# Patient Record
Sex: Female | Born: 1990 | Race: White | Hispanic: No | Marital: Married | Smoking: Never smoker
Health system: Southern US, Community
[De-identification: ages and names within clinical notes are randomized; demographics above are authoritative.]

## PROBLEM LIST (undated history)

## (undated) DIAGNOSIS — F419 Anxiety disorder, unspecified: Secondary | ICD-10-CM

## (undated) HISTORY — PX: NO PAST SURGERIES: SHX2092

---
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
## 2011-04-02 ENCOUNTER — Ambulatory Visit (INDEPENDENT_AMBULATORY_CARE_PROVIDER_SITE_OTHER): Payer: BC Managed Care – PPO | Admitting: Physician Assistant

## 2011-04-02 DIAGNOSIS — H6980 Other specified disorders of Eustachian tube, unspecified ear: Secondary | ICD-10-CM

## 2011-04-02 DIAGNOSIS — J309 Allergic rhinitis, unspecified: Secondary | ICD-10-CM

## 2011-12-28 ENCOUNTER — Ambulatory Visit (INDEPENDENT_AMBULATORY_CARE_PROVIDER_SITE_OTHER): Payer: BC Managed Care – PPO | Admitting: Physician Assistant

## 2011-12-28 VITALS — BP 100/66 | HR 46 | Temp 97.6°F | Resp 16 | Ht 68.0 in | Wt 161.0 lb

## 2011-12-28 DIAGNOSIS — J029 Acute pharyngitis, unspecified: Secondary | ICD-10-CM

## 2011-12-28 LAB — POCT RAPID STREP A (OFFICE): Rapid Strep A Screen: NEGATIVE

## 2011-12-28 MED ORDER — MAGIC MOUTHWASH W/LIDOCAINE: 10.0000 mL | ORAL | Status: DC | PRN

## 2011-12-28 MED ORDER — AZITHROMYCIN 250 MG PO TABS: 250.0000 mg | ORAL_TABLET | Freq: Two times a day (BID) | ORAL | Status: DC

## 2011-12-28 NOTE — Progress Notes (Signed)
  Subjective:    Patient ID: Holly Bryan, female    DOB: 1990-05-29, 21 y.o.   MRN: [id number]  HPI 21 year old female presents with acute onset of sore throat and painful bumps on her tongue. Symptoms started 5 days ago. States it is painful to swallow and has been worsening since onset.  Denies cough, fever, chills, nausea, vomiting, abdominal pain, headache, or nasal congestion. Does have chronic rhinorrhea which she attributes to allergies.  States the bumps on her tongue do seem to be worse at night and get better during the day.  No history of similar lesions.      Review of Systems  Constitutional: Negative for fever and chills.  HENT: Positive for sore throat, rhinorrhea and mouth sores (on tongue). Negative for ear pain, congestion and trouble swallowing.   Gastrointestinal: Negative for nausea, vomiting and abdominal pain.  Neurological: Negative for headaches.  All other systems reviewed and are negative.       Objective:   Physical Exam  Constitutional: She is oriented to person, place, and time. She appears well-developed and well-nourished.  HENT:  Head: Normocephalic and atraumatic.  Right Ear: Hearing, tympanic membrane, external ear and ear canal normal.  Left Ear: Hearing, tympanic membrane, external ear and ear canal normal.  Mouth/Throat: Uvula is midline, oropharynx is clear and moist and mucous membranes are normal. No oropharyngeal exudate (bilateral tonsillar erythema, no tonsillar swelling).       Tongue has multiple raised, inflamed taste buds. No ulceration or vesicles.    Eyes: Conjunctivae normal are normal.  Neck: Normal range of motion. Neck supple.  Cardiovascular: Normal rate, regular rhythm and normal heart sounds.   Pulmonary/Chest: Effort normal and breath sounds normal.  Lymphadenopathy:    She has no cervical adenopathy.  Neurological: She is alert and oriented to person, place, and time.  Psychiatric: She has a normal mood and affect. Her  behavior is normal. Judgment and thought content normal.          Assessment & Plan:   1. Acute pharyngitis  POCT rapid strep A, Culture, Group A Strep, Alum & Mag Hydroxide-Simeth (MAGIC MOUTHWASH W/LIDOCAINE) SOLN, azithromycin (ZITHROMAX) 250 MG tablet  throat culture sent Will treat to cover strep infection Use [facility]'s mouthwash prn pain Ibuprofen or tylenol as needed for sore throat Follow up if symptoms worsen or fail to improve.

## 2011-12-30 LAB — CULTURE, GROUP A STREP: Organism ID, Bacteria: NORMAL

## 2012-12-13 ENCOUNTER — Ambulatory Visit (INDEPENDENT_AMBULATORY_CARE_PROVIDER_SITE_OTHER): Payer: BC Managed Care – PPO | Admitting: Family Medicine

## 2012-12-13 VITALS — BP 98/65 | HR 50 | Temp 97.7°F | Resp 18 | Ht 68.0 in | Wt 169.0 lb

## 2012-12-13 DIAGNOSIS — B360 Pityriasis versicolor: Secondary | ICD-10-CM

## 2012-12-13 DIAGNOSIS — B350 Tinea barbae and tinea capitis: Secondary | ICD-10-CM

## 2012-12-13 DIAGNOSIS — J309 Allergic rhinitis, unspecified: Secondary | ICD-10-CM

## 2012-12-13 DIAGNOSIS — J019 Acute sinusitis, unspecified: Secondary | ICD-10-CM

## 2012-12-13 MED ORDER — IPRATROPIUM BROMIDE 0.03 % NA SOLN: 2.0000 | Freq: Four times a day (QID) | NASAL | Status: DC

## 2012-12-13 MED ORDER — AZITHROMYCIN 250 MG PO TABS: ORAL_TABLET | ORAL | Status: DC

## 2012-12-13 NOTE — Patient Instructions (Addendum)
We are going to try treating your for a sinus infection and underlying allergies.    Use the azithromycin as directed.  Also, try a daily claritin or zyrtec.  Use the nasal spray as needed.  If this is not helpful we might also try a steroid spray.   Try some Selsun Blue shampoo on your back to clear up your tinea versicolor.    Let me know if you are not better in the next week or so- Sooner if worse.

## 2012-12-13 NOTE — Progress Notes (Signed)
[facility] [address] [place] [address] [phone]  Date:  12/13/2012   Name:  Holly Bryan   DOB:  08/04/1990   MRN:  [id number]  PCP:  No PCP Per Patient    Chief Complaint: stuffy, congestion, cough, ear pain, ear popping   History of Present Illness:  Holly Bryan is a 22 y.o. very pleasant female patient who presents with the following:  She is here today with chronic congestion.  She has felt worse over the last week or so.   She noted onset of cough a few weeks ago- this is now resolved, but her nose is very stuffy and runny.  She does sneeze some.  "I don't know if it's allergies."   She has not noted any fever or chills. Cough is sometimes productive.  She sometimes has pressure and pain in her sinuses.   Headaches this week.   No GI symptoms.    She is generally healthy.   She has used a nasal spray, and either mucinex or claritin.    LMP 11/23/12  There are no active problems to display for this patient.   History reviewed. No pertinent past medical history.  History reviewed. No pertinent past surgical history.  History  Substance Use Topics  . Smoking status: Never Smoker   . Smokeless tobacco: Not on file  . Alcohol Use: Not on file    Family History  Problem Relation Age of Onset  . Lymphoma Father     Allergies  Allergen Reactions  . Amoxicillin     Childhood allergy    Medication list has been reviewed and updated.  Current Outpatient Prescriptions on File Prior to Visit  Medication Sig Dispense Refill  . norethindrone-ethinyl estradiol (JUNEL FE,GILDESS FE,LOESTRIN FE) 1-20 MG-MCG tablet Take 1 tablet by mouth daily.      . Alum & Mag Hydroxide-Simeth (MAGIC MOUTHWASH W/LIDOCAINE) SOLN Take 10 mLs by mouth every 2 (two) hours as needed. Use 1:1 ratio with viscous lidocaine and [facility]'s mouthwash  360 mL  0  . azithromycin (ZITHROMAX) 250 MG tablet Take 1 tablet (250 mg total) by mouth 2 (two) times daily.  10 each   0   No current facility-administered medications on file prior to visit.    Review of Systems:  As per HPI- otherwise negative.   Physical Examination: Filed Vitals:   12/13/12 0807  BP: 98/62  Pulse: 50  Temp: 97.7 F (36.5 C)  Resp: 18   Filed Vitals:   12/13/12 0807  Height: 5\' 8"  (1.727 m)  Weight: 169 lb (76.658 kg)   Body mass index is 25.7 kg/(m^2). Ideal Body Weight: Weight in (lb) to have BMI = 25: 164.1  GEN: WDWN, NAD, Non-toxic, A & O x 3 HEENT: Atraumatic, Normocephalic. Neck supple. No masses, No LAD.  Bilateral TM wnl, oropharynx normal.  PEERL,EOMI.   Nasal congestion, sinuses are tender Ears and Nose: No external deformity. CV: RRR, No M/G/R. No JVD. No thrill. No extra heart sounds. PULM: CTA B, no wheezes, crackles, rhonchi. No retractions. No resp. distress. No accessory muscle use. EXTR: No c/c/e NEURO Normal gait.  PSYCH: Normally interactive. Conversant. Not depressed or anxious appearing.  Calm demeanor.  She has a rash typical of tinea versicolor on her back.   Assessment and Plan: Sinusitis, acute - Plan: azithromycin (ZITHROMAX) 250 MG tablet  Allergic rhinitis - Plan: ipratropium (ATROVENT) 0.03 % nasal spray  Tinea versicolor  Treat as above for  AR/ sinusitis.  OTC selsum blue shampoo for Tinea versicolor  Signed Abbe Amsterdam, MD

## 2013-06-17 ENCOUNTER — Ambulatory Visit (INDEPENDENT_AMBULATORY_CARE_PROVIDER_SITE_OTHER): Payer: BC Managed Care – PPO | Admitting: Physician Assistant

## 2013-06-17 VITALS — BP 110/60 | HR 62 | Temp 98.3°F | Resp 16 | Ht 67.75 in | Wt 166.0 lb

## 2013-06-17 DIAGNOSIS — R350 Frequency of micturition: Secondary | ICD-10-CM

## 2013-06-17 DIAGNOSIS — N898 Other specified noninflammatory disorders of vagina: Secondary | ICD-10-CM

## 2013-06-17 LAB — POCT URINALYSIS DIPSTICK
Bilirubin, UA: NEGATIVE
Glucose, UA: NEGATIVE
Ketones, UA: NEGATIVE
Nitrite, UA: NEGATIVE
Protein, UA: NEGATIVE
Spec Grav, UA: 1.01
Urobilinogen, UA: 0.2
pH, UA: 7.5

## 2013-06-17 LAB — POCT WET PREP WITH KOH
KOH Prep POC: NEGATIVE
TRICHOMONAS UA: NEGATIVE
YEAST WET PREP PER HPF POC: NEGATIVE

## 2013-06-17 LAB — POCT UA - MICROSCOPIC ONLY
Casts, Ur, LPF, POC: NEGATIVE
Crystals, Ur, HPF, POC: NEGATIVE
Epithelial cells, urine per micros: NEGATIVE
Mucus, UA: NEGATIVE
Yeast, UA: NEGATIVE

## 2013-06-17 MED ORDER — PHENAZOPYRIDINE HCL 200 MG PO TABS: 200.0000 mg | ORAL_TABLET | Freq: Three times a day (TID) | ORAL | Status: DC | PRN

## 2013-06-17 MED ORDER — NITROFURANTOIN MONOHYD MACRO 100 MG PO CAPS: 100.0000 mg | ORAL_CAPSULE | Freq: Two times a day (BID) | ORAL | Status: DC

## 2013-06-17 NOTE — Progress Notes (Signed)
Subjective:    Patient ID: Holly Bryan, female    DOB: [birth year], 23 y.o.   MRN: [id number]  HPI Pt presents to clinic with urinary symptoms for the last 2 weeks - today at work she started to develop left sided back pain that made her leave work early.  She has dysuria and urinary urgency - she is not having nausea or fever or chills.  OTC meds - none No change in sexual partners  Review of Systems  Constitutional: Negative for fever and chills.  Gastrointestinal: Positive for abdominal pain.  Genitourinary: Positive for dysuria and frequency. Negative for urgency and vaginal discharge.       Strong smelling urine  Musculoskeletal: Positive for back pain (L side).       Objective:   Physical Exam  Vitals reviewed. Constitutional: She is oriented to person, place, and time. She appears well-developed and well-nourished.  HENT:  Head: Normocephalic and atraumatic.  Right Ear: External ear normal.  Left Ear: External ear normal.  Eyes: Conjunctivae are normal.  Neck: Normal range of motion.  Cardiovascular: Normal rate, regular rhythm and normal heart sounds.   No murmur heard. Pulmonary/Chest: Effort normal and breath sounds normal. She has no wheezes.  Abdominal: Soft. There is tenderness (suprapubic TTP). There is no CVA tenderness.  Musculoskeletal:       Back:  Neurological: She is alert and oriented to person, place, and time.  Skin: Skin is warm and dry.  Psychiatric: She has a normal mood and affect. Her behavior is normal. Judgment and thought content normal.   Results for orders placed in visit on 06/17/13  POCT URINALYSIS DIPSTICK      Result Value Ref Range   Color, UA light yellow     Clarity, UA clear     Glucose, UA neg     Bilirubin, UA neg     Ketones, UA neg     Spec Grav, UA 1.010     Blood, UA moderate     pH, UA 7.5     Protein, UA neg     Urobilinogen, UA 0.2     Nitrite, UA neg     Leukocytes, UA small (1+)    POCT UA - MICROSCOPIC ONLY      Result Value Ref Range   WBC, Ur, HPF, POC 0-1     RBC, urine, microscopic 0-2     Bacteria, U Microscopic trace     Mucus, UA neg     Epithelial cells, urine per micros neg     Crystals, Ur, HPF, POC neg     Casts, Ur, LPF, POC neg     Yeast, UA neg    POCT WET PREP WITH KOH      Result Value Ref Range   Trichomonas, UA Negative     Clue Cells Wet Prep HPF POC 2-4     Epithelial Wet Prep HPF POC 10-15     Yeast Wet Prep HPF POC neg     Bacteria Wet Prep HPF POC 3+     RBC Wet Prep HPF POC 0-1     WBC Wet Prep HPF POC 8-14     KOH Prep POC Negative         Assessment & Plan:  Urinary frequency - Plan: POCT urinalysis dipstick, POCT UA - Microscopic Only, Urine culture, nitrofurantoin, macrocrystal-monohydrate, (MACROBID) 100 MG capsule, phenazopyridine (PYRIDIUM) 200 MG tablet  Vaginal discharge - Plan: GC/Chlamydia Probe Amp, POCT Wet Prep with  KOH  Pt does not have BV and her symptoms are consistent with UTI so we will treat with abx while waiting for the urine culture.  I suspect her back pain is muscular in origin.  We will send uriprobe because it was collected and she requested that it be sent even with clear wet prep.  She drinks a lot of water so I wonder if her urinary results are due to her dilute urine.  Answered pt's questions.  Benny LennertSarah Teyonna Plaisted PA-C  [facility] 06/17/2013 5:42 PM

## 2013-06-18 LAB — GC/CHLAMYDIA PROBE AMP
CT Probe RNA: NEGATIVE
GC PROBE AMP APTIMA: NEGATIVE

## 2013-06-19 LAB — URINE CULTURE: Colony Count: >=100000

## 2019-01-12 ENCOUNTER — Other Ambulatory Visit: Payer: Self-pay

## 2019-01-12 DIAGNOSIS — Z20822 Contact with and (suspected) exposure to covid-19: Secondary | ICD-10-CM

## 2019-01-13 LAB — NOVEL CORONAVIRUS, NAA: SARS-CoV-2, NAA: NOT DETECTED

## 2019-05-05 ENCOUNTER — Other Ambulatory Visit: Payer: Self-pay

## 2019-11-18 ENCOUNTER — Other Ambulatory Visit: Payer: Self-pay

## 2020-02-28 LAB — OB RESULTS CONSOLE RPR: RPR: NONREACTIVE

## 2020-02-28 LAB — OB RESULTS CONSOLE RUBELLA ANTIBODY, IGM: Rubella: IMMUNE

## 2020-02-28 LAB — OB RESULTS CONSOLE HEPATITIS B SURFACE ANTIGEN: Hepatitis B Surface Ag: NEGATIVE

## 2020-02-28 LAB — OB RESULTS CONSOLE HIV ANTIBODY (ROUTINE TESTING): HIV: NONREACTIVE

## 2020-03-12 LAB — OB RESULTS CONSOLE GC/CHLAMYDIA
Chlamydia: NEGATIVE
Gonorrhea: NEGATIVE

## 2020-08-09 ENCOUNTER — Ambulatory Visit (HOSPITAL_COMMUNITY)
Admission: EM | Admit: 2020-08-09 | Discharge: 2020-08-09 | Disposition: A | Discharge status: Home / Self Care | Payer: 59 | Attending: Internal Medicine | Admitting: Internal Medicine

## 2020-08-09 ENCOUNTER — Encounter (HOSPITAL_COMMUNITY): Payer: Self-pay

## 2020-08-09 ENCOUNTER — Other Ambulatory Visit: Payer: Self-pay

## 2020-08-09 DIAGNOSIS — Z79899 Other long term (current) drug therapy: Secondary | ICD-10-CM | POA: Insufficient documentation

## 2020-08-09 DIAGNOSIS — Z882 Allergy status to sulfonamides status: Secondary | ICD-10-CM | POA: Diagnosis not present

## 2020-08-09 DIAGNOSIS — O98513 Other viral diseases complicating pregnancy, third trimester: Secondary | ICD-10-CM | POA: Diagnosis present

## 2020-08-09 DIAGNOSIS — J028 Acute pharyngitis due to other specified organisms: Secondary | ICD-10-CM | POA: Diagnosis not present

## 2020-08-09 DIAGNOSIS — Z880 Allergy status to penicillin: Secondary | ICD-10-CM | POA: Insufficient documentation

## 2020-08-09 DIAGNOSIS — Z3A32 32 weeks gestation of pregnancy: Secondary | ICD-10-CM | POA: Insufficient documentation

## 2020-08-09 DIAGNOSIS — B9789 Other viral agents as the cause of diseases classified elsewhere: Secondary | ICD-10-CM | POA: Diagnosis not present

## 2020-08-09 DIAGNOSIS — Z8616 Personal history of COVID-19: Secondary | ICD-10-CM | POA: Diagnosis not present

## 2020-08-09 DIAGNOSIS — U071 COVID-19: Secondary | ICD-10-CM | POA: Diagnosis not present

## 2020-08-09 DIAGNOSIS — J029 Acute pharyngitis, unspecified: Secondary | ICD-10-CM

## 2020-08-09 LAB — SARS CORONAVIRUS 2 (TAT 6-24 HRS): SARS Coronavirus 2: POSITIVE — AB

## 2020-08-09 MED ORDER — BENZONATATE 100 MG PO CAPS: 100.0000 mg | ORAL_CAPSULE | Freq: Three times a day (TID) | ORAL | 0 refills | Status: DC

## 2020-08-09 NOTE — Discharge Instructions (Addendum)
Salt water gargle Increase oral fluid intake We will call you with recommendations if your labs are abnormal Return to urgent care if symptoms worsen Tylenol as needed for fever and/or pain.

## 2020-08-09 NOTE — ED Triage Notes (Signed)
Pt reports sore throat, itchy throat and cough since last night. Denies fever, chills.   Pt reports she is 32 1/2 weeks pregant.

## 2020-08-10 NOTE — ED Provider Notes (Signed)
[facility]    CSN: [id number] Arrival date & time: 08/09/20  1337      History   Chief Complaint Chief Complaint  Patient presents with  . Sore Throat    HPI Holly Bryan is a 30 y.o. female comes to the urgent care with a 1 day history of sore throat, itchy throat and a nonproductive cough.  Patient denies any fever or chills.  No sick contacts.  Patient is currently [redacted] weeks pregnant.   She has some rhinorrhea.  No nausea, vomiting or diarrhea.  Patient is not vaccinated against COVID-19 virus.  HPI  History reviewed. No pertinent past medical history.  There are no problems to display for this patient.   History reviewed. No pertinent surgical history.  OB History   No obstetric history on file.      Home Medications    Prior to Admission medications   Medication Sig Start Date End Date Taking? Authorizing Provider  Prenatal Vit-DSS-Fe Cbn-FA (PRENATAL AD PO) Take by mouth.   Yes [provider]  norethindrone-ethinyl estradiol (JUNEL FE,GILDESS FE,LOESTRIN FE) 1-20 MG-MCG tablet Take 1 tablet by mouth daily.  08/09/20  [provider]    Family History Family History  Problem Relation Age of Onset  . Lymphoma Father     Social History Social History   Tobacco Use  . Smoking status: Never Smoker  . Smokeless tobacco: Never Used  Substance Use Topics  . Alcohol use: Never  . Drug use: Never     Allergies   Amoxicillin and Sulfa antibiotics   Review of Systems Review of Systems  Constitutional: Negative.   HENT: Positive for congestion and sore throat.   Respiratory: Positive for cough.   Gastrointestinal: Negative.   Genitourinary: Negative.      Physical Exam Triage Vital Signs ED Triage Vitals  Enc Vitals Group     BP 08/09/20 1449 107/69     Pulse Rate 08/09/20 1449 (!) 107     Resp 08/09/20 1449 18     Temp 08/09/20 1449 99 F (37.2 C)     Temp Source 08/09/20 1449 Oral     SpO2 08/09/20 1449 98 %      Weight --      Height --      Head Circumference --      Peak Flow --      Pain Score 08/09/20 1444 5     Pain Loc --      Pain Edu? --      Excl. in GC? --    No data found.  Updated Vital Signs BP 107/69 (BP Location: Left Arm)   Pulse (!) 107   Temp 99 F (37.2 C) (Oral)   Resp 18   SpO2 98%   Visual Acuity Right Eye Distance:   Left Eye Distance:   Bilateral Distance:    Right Eye Near:   Left Eye Near:    Bilateral Near:     Physical Exam Vitals and nursing note reviewed.  HENT:     Right Ear: Tympanic membrane normal.     Left Ear: Tympanic membrane normal.     Mouth/Throat:     Mouth: Mucous membranes are moist.     Pharynx: No posterior oropharyngeal erythema.  Cardiovascular:     Rate and Rhythm: Normal rate and regular rhythm.     Heart sounds: Normal heart sounds.  Pulmonary:     Effort: Pulmonary effort is normal.  Breath sounds: Normal breath sounds.  Neurological:     Mental Status: She is alert.      UC Treatments / Results  Labs (all labs ordered are listed, but only abnormal results are displayed) Labs Reviewed  SARS CORONAVIRUS 2 (TAT 6-24 HRS) - Abnormal; Notable for the following components:      Result Value   SARS Coronavirus 2 POSITIVE (*)    All other components within normal limits    EKG   Radiology No results found.  Procedures Procedures (including critical care time)  Medications Ordered in UC Medications - No data to display  Initial Impression / Assessment and Plan / UC Course  I have reviewed the triage vital signs and the nursing notes.  Pertinent labs & imaging results that were available during my care of the patient were reviewed by me and considered in my medical decision making (see chart for details).     1.  Viral pharyngitis: COVID-19 PCR test sent Patient is advised to quarantine until COVID-19 test results are available Increase oral fluid intake Tylenol as needed for fever and/or body  aches. If symptoms worsen patient is advised to return to urgent care to be reevaluated.   Final Clinical Impressions(s) / UC Diagnoses   Final diagnoses:  Viral pharyngitis     Discharge Instructions     Salt water gargle Increase oral fluid intake We will call you with recommendations if your labs are abnormal Return to urgent care if symptoms worsen Tylenol as needed for fever and/or pain.   ED Prescriptions    Medication Sig Dispense Auth. Provider   benzonatate (TESSALON) 100 MG capsule  (Status: Discontinued) Take 1 capsule (100 mg total) by mouth every 8 (eight) hours. 21 capsule Keyli Duross, Britta Mccreedy, MD     PDMP not reviewed this encounter.   Merrilee Jansky, MD 08/10/20 [phone]

## 2020-08-27 ENCOUNTER — Other Ambulatory Visit: Payer: Self-pay | Admitting: Obstetrics and Gynecology

## 2020-08-27 DIAGNOSIS — R748 Abnormal levels of other serum enzymes: Secondary | ICD-10-CM

## 2020-08-31 ENCOUNTER — Ambulatory Visit
Admission: RE | Admit: 2020-08-31 | Discharge: 2020-08-31 | Disposition: A | Discharge status: Home / Self Care | Payer: 59 | Source: Ambulatory Visit | Attending: Obstetrics and Gynecology | Admitting: Obstetrics and Gynecology

## 2020-08-31 DIAGNOSIS — R748 Abnormal levels of other serum enzymes: Secondary | ICD-10-CM

## 2020-09-04 LAB — OB RESULTS CONSOLE GBS: GBS: POSITIVE

## 2020-09-11 ENCOUNTER — Other Ambulatory Visit: Payer: 59

## 2020-09-27 ENCOUNTER — Inpatient Hospital Stay (HOSPITAL_COMMUNITY)
Admission: AD | Admit: 2020-09-27 | Discharge: 2020-09-30 | DRG: 807 | Disposition: A | Discharge status: Home / Self Care | Payer: 59 | Attending: Obstetrics and Gynecology | Admitting: Obstetrics and Gynecology

## 2020-09-27 ENCOUNTER — Other Ambulatory Visit: Payer: Self-pay

## 2020-09-27 ENCOUNTER — Encounter (HOSPITAL_COMMUNITY): Payer: Self-pay | Admitting: Obstetrics and Gynecology

## 2020-09-27 DIAGNOSIS — O26893 Other specified pregnancy related conditions, third trimester: Secondary | ICD-10-CM | POA: Diagnosis present

## 2020-09-27 DIAGNOSIS — Z3A39 39 weeks gestation of pregnancy: Secondary | ICD-10-CM

## 2020-09-27 DIAGNOSIS — Z3490 Encounter for supervision of normal pregnancy, unspecified, unspecified trimester: Secondary | ICD-10-CM

## 2020-09-27 DIAGNOSIS — O99824 Streptococcus B carrier state complicating childbirth: Secondary | ICD-10-CM | POA: Diagnosis present

## 2020-09-27 HISTORY — DX: Anxiety disorder, unspecified: F41.9

## 2020-09-27 LAB — CBC
HCT: 34.8 % — ABNORMAL LOW (ref 36.0–46.0)
Hemoglobin: 12.1 g/dL (ref 12.0–15.0)
MCH: 30.6 pg (ref 26.0–34.0)
MCHC: 34.8 g/dL (ref 30.0–36.0)
MCV: 88.1 fL (ref 80.0–100.0)
Platelets: 263 10*3/uL (ref 150–400)
RBC: 3.95 MIL/uL (ref 3.87–5.11)
RDW: 13 % (ref 11.5–15.5)
WBC: 12.9 10*3/uL — ABNORMAL HIGH (ref 4.0–10.5)
nRBC: 0 % (ref 0.0–0.2)

## 2020-09-27 LAB — TYPE AND SCREEN
ABO/RH(D): A POS
Antibody Screen: NEGATIVE

## 2020-09-27 MED ORDER — SOD CITRATE-CITRIC ACID 500-334 MG/5ML PO SOLN: 30.0000 mL | ORAL | Status: DC | PRN

## 2020-09-27 MED ORDER — PENICILLIN G POT IN DEXTROSE 60000 UNIT/ML IV SOLN
3.0000 10*6.[IU] | INTRAVENOUS | Status: DC
  Administered 2020-09-27 – 2020-09-28 (×3): 3 10*6.[IU] via INTRAVENOUS
  Filled 2020-09-27 (×3): qty 50

## 2020-09-27 MED ORDER — TERBUTALINE SULFATE 1 MG/ML IJ SOLN: 0.2500 mg | Freq: Once | INTRAMUSCULAR | Status: DC | PRN

## 2020-09-27 MED ORDER — LACTATED RINGERS IV SOLN: INTRAVENOUS | Status: DC

## 2020-09-27 MED ORDER — LIDOCAINE HCL (PF) 1 % IJ SOLN
30.0000 mL | INTRAMUSCULAR | Status: AC | PRN
  Administered 2020-09-28: 30 mL via SUBCUTANEOUS
  Filled 2020-09-27: qty 30

## 2020-09-27 MED ORDER — OXYTOCIN BOLUS FROM INFUSION
333.0000 mL | Freq: Once | INTRAVENOUS | Status: AC
  Administered 2020-09-28: 333 mL via INTRAVENOUS

## 2020-09-27 MED ORDER — ACETAMINOPHEN 325 MG PO TABS: 650.0000 mg | ORAL_TABLET | ORAL | Status: DC | PRN

## 2020-09-27 MED ORDER — OXYCODONE-ACETAMINOPHEN 5-325 MG PO TABS
1.0000 | ORAL_TABLET | ORAL | Status: DC | PRN
Start: 2020-09-27 — End: 2020-09-28

## 2020-09-27 MED ORDER — ONDANSETRON HCL 4 MG/2ML IJ SOLN: 4.0000 mg | Freq: Four times a day (QID) | INTRAMUSCULAR | Status: DC | PRN

## 2020-09-27 MED ORDER — BUTORPHANOL TARTRATE 1 MG/ML IJ SOLN
1.0000 mg | INTRAMUSCULAR | Status: DC | PRN
  Administered 2020-09-27 – 2020-09-28 (×2): 1 mg via INTRAVENOUS
  Filled 2020-09-27 (×2): qty 1

## 2020-09-27 MED ORDER — LACTATED RINGERS IV SOLN
500.0000 mL | INTRAVENOUS | Status: DC | PRN
Start: 2020-09-27 — End: 2020-09-28

## 2020-09-27 MED ORDER — OXYTOCIN-SODIUM CHLORIDE 30-0.9 UT/500ML-% IV SOLN: 2.5000 [IU]/h | INTRAVENOUS | Status: DC

## 2020-09-27 MED ORDER — OXYCODONE-ACETAMINOPHEN 5-325 MG PO TABS: 2.0000 | ORAL_TABLET | ORAL | Status: DC | PRN

## 2020-09-27 MED ORDER — PENICILLIN G POTASSIUM 5000000 UNITS IJ SOLR
5.0000 10*6.[IU] | Freq: Once | INTRAMUSCULAR | Status: AC
  Administered 2020-09-27: 5 10*6.[IU] via INTRAVENOUS
  Filled 2020-09-27: qty 5

## 2020-09-27 MED ORDER — OXYTOCIN-SODIUM CHLORIDE 30-0.9 UT/500ML-% IV SOLN
1.0000 m[IU]/min | INTRAVENOUS | Status: DC
  Administered 2020-09-27: 2 m[IU]/min via INTRAVENOUS
  Filled 2020-09-27: qty 500

## 2020-09-27 MED ORDER — HYDROXYZINE HCL 50 MG PO TABS: 50.0000 mg | ORAL_TABLET | Freq: Four times a day (QID) | ORAL | Status: DC | PRN

## 2020-09-27 NOTE — MAU Note (Signed)
.  Holly Bryan is a 30 y.o. at [redacted]w[redacted]d here in MAU reporting: sent from office for SROM. Patient unaware of when her water broke. Also has bloody show. Was 4.5 cm in the office today with pooling on exam. GBS pos  Pain score: 4 Vitals:   09/27/20 1753  BP: 132/78  Pulse: 79  Resp: 17  Temp: 98.3 F (36.8 C)  SpO2: 100%

## 2020-09-27 NOTE — Progress Notes (Signed)
Labor Progress Note  Patient reports increased contraction pain and frequency.  Bloody show present.  It was been 4 hrs since she was checked in office.  Cervix with change to 5/90/-2.  Cervix posterior and to patient left.    FHT Cat 1 since admission, very active baby.  Discussed PCN time (will be 4 hrs of coverage around 11 pm).  Her labor is progressing, but will plan to augment at next check if not significantly changed.  Discussed s/s chorioamnionitis.  All questions answered.  Epidural when desired.  Nilda Simmer MD

## 2020-09-27 NOTE — H&P (Signed)
OB History and Physical   Holly Bryan is a 30 y.o. female G1P0 presenting for SROM at [redacted]w[redacted]d.  She presented to her routine OB visit today and SROM was confirmed on speculum exam.  Cervix 4/90/-2. She reports good FM and irregular contractions. She reports bloody show since arrival. She denies fever, chills.   OB History     Gravida  1   Para      Term      Preterm      AB      Living         SAB      IAB      Ectopic      Multiple      Live Births             Past Medical History:  Diagnosis Date   Anxiety    Past Surgical History:  Procedure Laterality Date   NO PAST SURGERIES     Family History: family history includes Lymphoma in her father. Social History:  reports that she has never smoked. She has never used smokeless tobacco. She reports that she does not drink alcohol and does not use drugs.     Maternal Diabetes: No Genetic Screening: Normal Maternal Ultrasounds/Referrals: Normal Fetal Ultrasounds or other Referrals:  None Maternal Substance Abuse:  No Significant Maternal Medications:  None Significant Maternal Lab Results:  Group B Strep positive Other Comments:  None  Review of Systems See HPI History Exam by:: Dr. Crissie Reese Blood pressure 119/73, pulse 62, temperature 98.3 F (36.8 C), temperature source Oral, resp. rate 18, height 5\' 8"  (1.727 m), weight 88.5 kg, SpO2 100 %. Exam Physical Exam  Gen: alert, well appearing, no distress Chest: nonlabored breathing CV: no peripheral edema Abdomen: soft, nontender Ext: no evidence of DVT  Prenatal labs: ABO, Rh: --/--/A POS (07/07 1820) Antibody: NEG (07/07 1820) Rubella:   RI RPR:   WNL HBsAg:   WNL HIV:   neg GBS:   Positive, clindamycin resistant  Assessment/Plan: Admit to Labor and Delivery Unsure of time of ROM, suspect slow leak.  SROM confirmed in office with speculum exam. GBS positive with Clindamycin resistance. Chart states Amoxicillin allergy, however patient  reports this was previous error when she meant to say Sulfa Abx. I again confirmed no PCN allergy. Epidural when desired Augment with pitocin Anticipate vaginal delivery.    11-02-1982 09/27/2020, 7:24 PM

## 2020-09-28 ENCOUNTER — Inpatient Hospital Stay (HOSPITAL_COMMUNITY): Payer: 59 | Admitting: Anesthesiology

## 2020-09-28 ENCOUNTER — Encounter (HOSPITAL_COMMUNITY): Payer: Self-pay | Admitting: Obstetrics and Gynecology

## 2020-09-28 LAB — RPR: RPR Ser Ql: NONREACTIVE

## 2020-09-28 MED ORDER — PHENYLEPHRINE 40 MCG/ML (10ML) SYRINGE FOR IV PUSH (FOR BLOOD PRESSURE SUPPORT): 80.0000 ug | PREFILLED_SYRINGE | INTRAVENOUS | Status: DC | PRN

## 2020-09-28 MED ORDER — DIPHENHYDRAMINE HCL 50 MG/ML IJ SOLN: 12.5000 mg | INTRAMUSCULAR | Status: DC | PRN

## 2020-09-28 MED ORDER — COCONUT OIL OIL: 1.0000 "application " | TOPICAL_OIL | Status: DC | PRN

## 2020-09-28 MED ORDER — ONDANSETRON HCL 4 MG/2ML IJ SOLN: 4.0000 mg | INTRAMUSCULAR | Status: DC | PRN

## 2020-09-28 MED ORDER — ACETAMINOPHEN 325 MG PO TABS
650.0000 mg | ORAL_TABLET | ORAL | Status: DC | PRN
  Administered 2020-09-30: 650 mg via ORAL
  Filled 2020-09-28: qty 2

## 2020-09-28 MED ORDER — FENTANYL-BUPIVACAINE-NACL 0.5-0.125-0.9 MG/250ML-% EP SOLN
EPIDURAL | Status: AC
  Filled 2020-09-28: qty 250

## 2020-09-28 MED ORDER — EPHEDRINE 5 MG/ML INJ: 10.0000 mg | INTRAVENOUS | Status: DC | PRN

## 2020-09-28 MED ORDER — FENTANYL-BUPIVACAINE-NACL 0.5-0.125-0.9 MG/250ML-% EP SOLN
12.0000 mL/h | EPIDURAL | Status: DC | PRN
  Administered 2020-09-28: 12 mL/h via EPIDURAL

## 2020-09-28 MED ORDER — DIPHENHYDRAMINE HCL 25 MG PO CAPS: 25.0000 mg | ORAL_CAPSULE | Freq: Four times a day (QID) | ORAL | Status: DC | PRN

## 2020-09-28 MED ORDER — BENZOCAINE-MENTHOL 20-0.5 % EX AERO
1.0000 "application " | INHALATION_SPRAY | CUTANEOUS | Status: DC | PRN
  Administered 2020-09-30: 1 via TOPICAL
  Filled 2020-09-28 (×2): qty 56

## 2020-09-28 MED ORDER — OXYCODONE HCL 5 MG PO TABS: 10.0000 mg | ORAL_TABLET | ORAL | Status: DC | PRN

## 2020-09-28 MED ORDER — WITCH HAZEL-GLYCERIN EX PADS: 1.0000 "application " | MEDICATED_PAD | CUTANEOUS | Status: DC | PRN

## 2020-09-28 MED ORDER — TETANUS-DIPHTH-ACELL PERTUSSIS 5-2.5-18.5 LF-MCG/0.5 IM SUSY: 0.5000 mL | PREFILLED_SYRINGE | Freq: Once | INTRAMUSCULAR | Status: DC

## 2020-09-28 MED ORDER — SENNOSIDES-DOCUSATE SODIUM 8.6-50 MG PO TABS
2.0000 | ORAL_TABLET | ORAL | Status: DC
  Administered 2020-09-29: 2 via ORAL
  Filled 2020-09-28: qty 2

## 2020-09-28 MED ORDER — PRENATAL MULTIVITAMIN CH
1.0000 | ORAL_TABLET | Freq: Every day | ORAL | Status: DC
  Administered 2020-09-29: 1 via ORAL
  Filled 2020-09-28: qty 1

## 2020-09-28 MED ORDER — LIDOCAINE HCL (PF) 1 % IJ SOLN
INTRAMUSCULAR | Status: DC | PRN
  Administered 2020-09-28 (×2): 5 mL via EPIDURAL

## 2020-09-28 MED ORDER — LACTATED RINGERS IV SOLN: 500.0000 mL | Freq: Once | INTRAVENOUS | Status: DC

## 2020-09-28 MED ORDER — IBUPROFEN 600 MG PO TABS
600.0000 mg | ORAL_TABLET | Freq: Four times a day (QID) | ORAL | Status: DC
  Administered 2020-09-28 – 2020-09-30 (×7): 600 mg via ORAL
  Filled 2020-09-28 (×7): qty 1

## 2020-09-28 MED ORDER — ONDANSETRON HCL 4 MG PO TABS: 4.0000 mg | ORAL_TABLET | ORAL | Status: DC | PRN

## 2020-09-28 MED ORDER — OXYCODONE HCL 5 MG PO TABS: 5.0000 mg | ORAL_TABLET | ORAL | Status: DC | PRN

## 2020-09-28 MED ORDER — DIBUCAINE (PERIANAL) 1 % EX OINT: 1.0000 "application " | TOPICAL_OINTMENT | CUTANEOUS | Status: DC | PRN

## 2020-09-28 MED ORDER — ZOLPIDEM TARTRATE 5 MG PO TABS: 5.0000 mg | ORAL_TABLET | Freq: Every evening | ORAL | Status: DC | PRN

## 2020-09-28 MED ORDER — SIMETHICONE 80 MG PO CHEW: 80.0000 mg | CHEWABLE_TABLET | ORAL | Status: DC | PRN

## 2020-09-28 NOTE — Anesthesia Procedure Notes (Signed)
Epidural Patient location during procedure: OB Start time: 09/28/2020 3:05 AM End time: 09/28/2020 3:16 AM  Staffing Anesthesiologist: Mellody Dance, MD Performed: anesthesiologist   Preanesthetic Checklist Completed: patient identified, IV checked, site marked, risks and benefits discussed, monitors and equipment checked, pre-op evaluation and timeout performed  Epidural Patient position: sitting Prep: DuraPrep Patient monitoring: heart rate, cardiac monitor, continuous pulse ox and blood pressure Approach: midline Location: L3-L4 Injection technique: LOR saline  Needle:  Needle type: Tuohy  Needle gauge: 17 G Needle length: 9 cm Needle insertion depth: 9 cm Catheter type: closed end flexible Catheter size: 20 Guage Catheter at skin depth: 14 cm Test dose: negative and Other  Assessment Events: blood not aspirated, injection not painful, no injection resistance and negative IV test  Additional Notes Informed consent obtained prior to proceeding including risk of failure, 1% risk of PDPH, risk of minor discomfort and bruising.  Discussed rare but serious complications including epidural abscess, permanent nerve injury, epidural hematoma.  Discussed alternatives to epidural analgesia and patient desires to proceed.  Timeout performed pre-procedure verifying patient name, procedure, and platelet count.  Patient tolerated procedure well.

## 2020-09-28 NOTE — Anesthesia Preprocedure Evaluation (Signed)
Anesthesia Evaluation  Patient identified by MRN, date of birth, ID band Patient awake    Reviewed: Allergy & Precautions, NPO status , Patient's Chart, lab work & pertinent test results  Airway Mallampati: II  TM Distance: >3 FB Neck ROM: Full    Dental no notable dental hx.    Pulmonary neg pulmonary ROS,    Pulmonary exam normal breath sounds clear to auscultation       Cardiovascular negative cardio ROS Normal cardiovascular exam Rhythm:Regular Rate:Normal     Neuro/Psych PSYCHIATRIC DISORDERS Anxiety negative neurological ROS     GI/Hepatic negative GI ROS, Neg liver ROS,   Endo/Other  negative endocrine ROS  Renal/GU negative Renal ROS  negative genitourinary   Musculoskeletal negative musculoskeletal ROS (+)   Abdominal   Peds negative pediatric ROS (+)  Hematology negative hematology ROS (+)   Anesthesia Other Findings   Reproductive/Obstetrics (+) Pregnancy                             Anesthesia Physical Anesthesia Plan  ASA: 2  Anesthesia Plan: Epidural   Post-op Pain Management:    Induction:   PONV Risk Score and Plan: 2 and Treatment may vary due to age or medical condition  Airway Management Planned: Natural Airway  Additional Equipment:   Intra-op Plan:   Post-operative Plan:   Informed Consent: I have reviewed the patients History and Physical, chart, labs and discussed the procedure including the risks, benefits and alternatives for the proposed anesthesia with the patient or authorized representative who has indicated his/her understanding and acceptance.       Plan Discussed with: Anesthesiologist  Anesthesia Plan Comments:         Anesthesia Quick Evaluation

## 2020-09-28 NOTE — Lactation Note (Signed)
This note was copied from a baby's chart. Lactation Consultation Note  Patient Name: Holly Bryan [id number] Date: 09/28/2020 Reason for consult: Initial assessment;1st time breastfeeding;Term Age:30 hours Per mom, infant not latched since L&D mostly been sleeping mom has made 3 attempts and been giving infant back EBM on gloved finger that she hand expressed.  Infant is currently asleep in the bassinet and mom will call LC  for assistance with next latch, LC written on white board in patient's room. LC encourage mom to continue to do lots of STS with infant. LC discussed infant's input and output with parents. Continue to BF infant according to cues, 8 to 12 or more times within 24 hours, STS, do not make infant wait to feed , if past 4 hours. Mom made aware of O/P services, breastfeeding support groups, community resources, and our phone # for post-discharge questions.    Maternal Data Has patient been taught Hand Expression?: Yes Does the patient have breastfeeding experience prior to this delivery?: No  Feeding Mother's Current Feeding Choice: Breast Milk  LATCH Score                    Lactation Tools Discussed/Used    Interventions Interventions: Breast feeding basics reviewed;Skin to skin;Hand express  Discharge WIC Program: No  Consult Status Consult Status: Follow-up Date: 09/29/20 Follow-up type: In-patient    Danelle Earthly 09/28/2020, 6:18 PM

## 2020-09-28 NOTE — Lactation Note (Signed)
This note was copied from a baby's chart. Lactation Consultation Note  Patient Name: Holly Bryan [id number] Date: 09/28/2020 Reason for consult: L&D Initial assessment;Primapara;1st time breastfeeding;Term;Other (Comment) (LC - L/D visit at 50 mins PP . per LD RN had attempted to latch earlier. baby awake and rooting when LC entered/ LC assisted mom to obtain the depth and baby fed for 8 mins / few swallows and able to express drops of colostrum after latch. baby released.) Age: 57 mins PP .  Latch score 8 .  Mom aware she will be seen on MBU later today.  Maternal Data Has patient been taught Hand Expression?: Yes Does the patient have breastfeeding experience prior to this delivery?: No  Feeding Mother's Current Feeding Choice: Breast Milk  LATCH Score Latch: Grasps breast easily, tongue down, lips flanged, rhythmical sucking.  Audible Swallowing: A few with stimulation  Type of Nipple: Everted at rest and after stimulation  Comfort (Breast/Nipple): Soft / non-tender  Hold (Positioning): Assistance needed to correctly position infant at breast and maintain latch.  LATCH Score: 8   Lactation Tools Discussed/Used    Interventions Interventions: Breast feeding basics reviewed;Assisted with latch;Skin to skin;Hand express;Adjust position;Support pillows  Discharge    Consult Status Consult Status: Follow-up (from LD) Date: 09/28/20 Follow-up type: In-patient    Matilde Sprang Hilda Rynders 09/28/2020, 11:46 AM

## 2020-09-28 NOTE — Progress Notes (Signed)
Delivery Note At 10:27 AM a viable female was delivered via Vaginal, Spontaneous (Presentation: Left Occiput Anterior).  APGAR: 9, 9; weight  .   Placenta status: Spontaneous, Intact.  Cord: 3 vessels with the following complications: Nuchal cord x 1.  Cord pH: pending Terminal bradycardia with crowning <100 BPM> MLE and delivered in one UC. Anesthesia: Epidural Episiotomy: Second degree MLE repaired Lacerations:   Suture Repair: 2.0 vicryl rapide Est. Blood Loss (mL):  150  Mom to postpartum.  Baby to Couplet care / Skin to Skin.  Roselle Locus II 09/28/2020, 10:46 AM

## 2020-09-28 NOTE — Lactation Note (Signed)
This note was copied from a baby's chart. Lactation Consultation Note  Patient Name: Holly Bryan JIRCV'E Date: 09/28/2020 Reason for consult: Follow-up assessment;Mother's request;1st time breastfeeding;Term Age:30 hours Dad changed stool ( meconium) while LC was in the room. Mom was taught hand expression and infant was given 4 mls of colostrum by spoon, afterwards infant started cuing to breastfeed. Mom latched infant on her left breast using the football hold position, infant latched with depth, audible swallowing observe, infant was still BF after 10 minutes when LC left the room.  Mom will continue to breastfeed infant according to hunger cues, 8 to 12+ or more times within 24 hours, STS. Mom knows to call RN or LC if she needs further assistance with latching infant at the breast.  Maternal Data Has patient been taught Hand Expression?: Yes Does the patient have breastfeeding experience prior to this delivery?: No  Feeding Mother's Current Feeding Choice: Breast Milk  LATCH Score Latch: Grasps breast easily, tongue down, lips flanged, rhythmical sucking.  Audible Swallowing: Spontaneous and intermittent  Type of Nipple: Everted at rest and after stimulation  Comfort (Breast/Nipple): Soft / non-tender  Hold (Positioning): Assistance needed to correctly position infant at breast and maintain latch.  LATCH Score: 9   Lactation Tools Discussed/Used    Interventions Interventions: Assisted with latch;Skin to skin;Hand express;Breast compression;Adjust position;Support pillows;Position options;Expressed milk;Education  Discharge WIC Program: No  Consult Status Consult Status: Follow-up Date: 09/29/20 Follow-up type: In-patient    Danelle Earthly 09/28/2020, 7:41 PM

## 2020-09-29 LAB — CBC
HCT: 30.5 % — ABNORMAL LOW (ref 36.0–46.0)
Hemoglobin: 10.4 g/dL — ABNORMAL LOW (ref 12.0–15.0)
MCH: 30.6 pg (ref 26.0–34.0)
MCHC: 34.1 g/dL (ref 30.0–36.0)
MCV: 89.7 fL (ref 80.0–100.0)
Platelets: 201 10*3/uL (ref 150–400)
RBC: 3.4 MIL/uL — ABNORMAL LOW (ref 3.87–5.11)
RDW: 13.2 % (ref 11.5–15.5)
WBC: 13.4 10*3/uL — ABNORMAL HIGH (ref 4.0–10.5)
nRBC: 0 % (ref 0.0–0.2)

## 2020-09-29 NOTE — Progress Notes (Signed)
CSW met with MOB to complete consult for history of anxiety. CSW observed FOB at bedside assisting MOB with breast feeding infant. MOB gave CSW verbal consent to complete consult while FOB was present. CSW explained role and reason for consult. MOB was pleasant, polite, and engaged with CSW. MOB reported, dx of anxiety from two years ago. MOB reported, history of Buspirone and her last dosage being in November. MOB reported, since then she has been able to manage symptoms without any medication needed. CSW asked MOB does she plan to resume medication. MOB reported, she is unsure if she will resume, but does know how to contact provider if needed.CSW encourage MOB to implement healthy coping skills when symptoms arises.   CSW provided education regarding the baby blues period vs. perinatal mood disorders, discussed treatment and gave resources for mental health follow up if concerns arise. CSW recommends self- evaluation during the postpartum time period using the New Mom Checklist from Postpartum Progress and encouraged MOB to contact a medical professional if symptoms are noted at any time.   When CSW asked MOB of her emotions since delivery. MOB reported, she feels, "good, but tired". MOB reported, FOB, family, and friends are her supports. MOB denied SI, and HI when CSW assessed for safety.   MOB reported, here are no barriers to follow up infant's care. MOB reported, she has all essentials needed to care for infant. MOB reported, infant has a car seat and bassinet. MOB denied any additional barriers.     CSW provided education on sudden infant death syndrome (SIDS).  CSW contact lactation regarding MOB's difficulties breast feeding.   CSW identifies no further need for intervention or barriers to discharge at this time.   Dickie Labarre, MSW, LCSW-A Clinical Social Worker- Weekends [phone]  

## 2020-09-29 NOTE — Progress Notes (Signed)
CSW acknowledge and has complete consult for history of anxiety. CSW will complete note when time permits. CSW identifies no further need for intervention or barriers to discharge at this time.   Dolores Frame, MSW, LCSW-A Clinical Social Worker [phone]

## 2020-09-29 NOTE — Lactation Note (Signed)
This note was copied from a baby's chart. Lactation Consultation Note  Patient Name: Holly Bryan HYIFO'Y Date: 09/29/2020 Reason for consult: Follow-up assessment;Term;Primapara;1st time breastfeeding;Infant weight loss Age:30 hours- post circ ,  Baby sluggish from the Tylenol .  Latch of 7 .  LC encouraged to call with feeding cues.   Maternal Data Has patient been taught Hand Expression?: Yes  Feeding Mother's Current Feeding Choice: Breast Milk  LATCH Score Latch: Grasps breast easily, tongue down, lips flanged, rhythmical sucking.  Audible Swallowing: None  Type of Nipple: Everted at rest and after stimulation  Comfort (Breast/Nipple): Soft / non-tender  Hold (Positioning): Assistance needed to correctly position infant at breast and maintain latch.  LATCH Score: 7   Lactation Tools Discussed/Used    Interventions Interventions: Breast feeding basics reviewed;Assisted with latch;Skin to skin;Breast massage;Hand express;Education  Discharge Pump: Personal;DEBP  Consult Status Consult Status: Follow-up Date: 09/29/20 Follow-up type: In-patient    Holly Bryan 09/29/2020, 11:44 AM

## 2020-09-29 NOTE — Lactation Note (Signed)
This note was copied from a baby's chart. Lactation Consultation Note  Patient Name: Holly Bryan Date: 09/29/2020 Reason for consult: Follow-up assessment;1st time breastfeeding;Primapara;Term;Infant weight loss;Other (Comment) (3 % weight loss/ baby in the tx nursery for a circ/ LC encouraged mom to call with feeding cues. LC reviewed potential feeding behaviors after circ. mom mentioned the baby fed x 2 prior to going for a circ.) Age:30 hours Per mom breast feeding is going well.   Maternal Data    Feeding Mother's Current Feeding Choice: Breast Milk  LATCH Score ( Latch prior to this LC visit.)  Latch: Repeated attempts needed to sustain latch, nipple held in mouth throughout feeding, stimulation needed to elicit sucking reflex.  Audible Swallowing: Spontaneous and intermittent  Type of Nipple: Everted at rest and after stimulation  Comfort (Breast/Nipple): Soft / non-tender  Hold (Positioning): Assistance needed to correctly position infant at breast and maintain latch.  LATCH Score: 8   Lactation Tools Discussed/Used    Interventions    Discharge    Consult Status Consult Status: Follow-up Date: 09/29/20 Follow-up type: In-patient    Matilde Sprang Jelesa Mangini 09/29/2020, 8:44 AM

## 2020-09-29 NOTE — Progress Notes (Signed)
Post Partum Day 1 Subjective: no complaints, up ad lib, voiding, tolerating PO, and + flatus  Objective: Blood pressure 105/62, pulse (!) 55, temperature 98.3 F (36.8 C), resp. rate 16, height 5\' 8"  (1.727 m), weight 88.5 kg, SpO2 98 %, unknown if currently breastfeeding.  Physical Exam:  General: alert, cooperative, and no distress Lochia: appropriate Uterine Fundus: firm Incision: healing well DVT Evaluation: No evidence of DVT seen on physical exam.  Recent Labs    09/27/20 1826 09/29/20 0506  HGB 12.1 10.4*  HCT 34.8* 30.5*    Assessment/Plan: Plan for discharge tomorrow   LOS: 2 days   11/30/20 II 09/29/2020, 8:17 AM

## 2020-09-29 NOTE — Lactation Note (Signed)
This note was copied from a baby's chart. Lactation Consultation Note  Patient Name: Holly Bryan ASTMH'D Date: 09/29/2020 Reason for consult: Follow-up assessment;Mother's request;1st time breastfeeding;Term (-3% weight loss) Age:30 hours, infant had e voids and 2 stools today, dad changed green stool while LC was in the room. Mom wanted LC assess latch, infant had previously BF for 40 minutes and mom re-latched infant additional 5 minutes on her right breast using the football hold position, infant latched with depth, audible swallows observed. Per mom, she was having some breast soreness but no trauma nor abrasions noted on mom's nipples.  Mom knows to use her EBM and let air dry on nipples or her nipple cream to help alleviate nipple soreness. Mom knows if she feels pain with latch to break latch and re-latch infant at the breast or ask RN or LC for further latch assistance.  Infant has started to cluster feeding, mom understands this is normal infant feeding behavior for day 2 of life. Mom knows she can hand express and give infant back extra volume of colostrum by spoon if she chooses, LC reviewed hand expression and infant was given 4 mls of colostrum by spoon.  Mom will continue to breastfeed infant according to feeding cues.  Maternal Data    Feeding Mother's Current Feeding Choice: Breast Milk  LATCH Score Latch: Grasps breast easily, tongue down, lips flanged, rhythmical sucking.  Audible Swallowing: Spontaneous and intermittent  Type of Nipple: Everted at rest and after stimulation  Comfort (Breast/Nipple): Soft / non-tender  Hold (Positioning): No assistance needed to correctly position infant at breast.  LATCH Score: 10   Lactation Tools Discussed/Used    Interventions Interventions: Breast compression;Hand express;Breast massage;Skin to skin;Position options;Expressed milk;Coconut oil;Comfort gels  Discharge    Consult Status Consult Status:  Follow-up Date: 09/30/20 Follow-up type: In-patient    Danelle Earthly 09/29/2020, 6:17 PM

## 2020-09-30 MED ORDER — IBUPROFEN 600 MG PO TABS: 600.0000 mg | ORAL_TABLET | Freq: Four times a day (QID) | ORAL | 0 refills | Status: DC | PRN

## 2020-09-30 MED ORDER — ACETAMINOPHEN 325 MG PO TABS: 650.0000 mg | ORAL_TABLET | Freq: Four times a day (QID) | ORAL | 0 refills | Status: DC | PRN

## 2020-09-30 NOTE — Discharge Summary (Signed)
Postpartum Discharge Summary  Date of Service updated7/10/22     Patient Name: Holly Bryan DOB: 12-14-1990 MRN: [id number]  Date of admission: 09/27/2020 Delivery date:09/28/2020  Delivering provider: Everlene Farrier  Date of discharge: 09/30/2020  Admitting diagnosis: Pregnancy [Z34.90] Intrauterine pregnancy: [redacted]w[redacted]d    Secondary diagnosis:  Active Problems:   Pregnancy  Additional problems:     Discharge diagnosis: Term Pregnancy Delivered                                              Post partum procedures: Augmentation: Pitocin Complications: None  Hospital course: Onset of Labor With Vaginal Delivery      30y.o. yo G1P1001 at 306w4das admitted in Active Labor on 09/27/2020. Patient had an uncomplicated labor course as follows:  Membrane Rupture Time/Date: 10:00 AM ,09/27/2020   Delivery Method:Vaginal, Spontaneous  Episiotomy: Median  Lacerations:  2nd degree  Patient had an uncomplicated postpartum course.  She is ambulating, tolerating a regular diet, passing flatus, and urinating well. Patient is discharged home in stable condition on 09/30/20.  Newborn Data: Birth date:09/28/2020  Birth time:10:27 AM  Gender:Female  Living status:Living  Apgars:9 ,9  Weight:3280 g   Magnesium Sulfate received: No BMZ received: No Rhophylac:No MMR:No T-DaP:Given prenatally Flu: No Transfusion:No  Physical exam  Vitals:   09/29/20 0225 09/29/20 1331 09/29/20 2200 09/30/20 0522  BP: 105/62 107/67 103/63 102/67  Pulse: (!) 55 (!) 59 (!) 52 (!) 50  Resp: 16 18 18 18   Temp: 98.3 F (36.8 C) 97.9 F (36.6 C) 98.2 F (36.8 C) 97.8 F (36.6 C)  TempSrc:  Axillary Oral Axillary  SpO2: 98% 99% 98% 100%  Weight:      Height:       General: alert, cooperative, and no distress Lochia: appropriate Uterine Fundus: firm Incision: Healing well with no significant drainage DVT Evaluation: No evidence of DVT seen on physical exam. Labs: Lab Results  Component Value Date   WBC  13.4 (H) 09/29/2020   HGB 10.4 (L) 09/29/2020   HCT 30.5 (L) 09/29/2020   MCV 89.7 09/29/2020   PLT 201 09/29/2020   No flowsheet data found. Edinburgh Score: Edinburgh Postnatal Depression Scale Screening Tool 09/29/2020  I have been able to laugh and see the funny side of things. 0  I have looked forward with enjoyment to things. 0  I have blamed myself unnecessarily when things went wrong. 0  I have been anxious or worried for no good reason. 0  I have felt scared or panicky for no good reason. 0  Things have been getting on top of me. 0  I have been so unhappy that I have had difficulty sleeping. 0  I have felt sad or miserable. 0  I have been so unhappy that I have been crying. 0  The thought of harming myself has occurred to me. 0  Edinburgh Postnatal Depression Scale Total 0      After visit meds:  Allergies as of 09/30/2020       Reactions   Sulfa Antibiotics Other (See Comments)   Unknown childhood reaction        Medication List     TAKE these medications    acetaminophen 325 MG tablet Commonly known as: Tylenol Take 2 tablets (650 mg total) by mouth every 6 (six) hours as needed (for pain  scale < 4).   ibuprofen 600 MG tablet Commonly known as: ADVIL Take 1 tablet (600 mg total) by mouth every 6 (six) hours as needed.   PRENATAL AD PO Take by mouth.         Discharge home in stable condition Infant Feeding: Breast Infant Disposition:home with mother Discharge instruction: per After Visit Summary and Postpartum booklet. Activity: Advance as tolerated. Pelvic rest for 6 weeks.  Diet: routine diet Anticipated Birth Control: Unsure Postpartum Appointment:6 weeks Additional Postpartum F/U:  Future Appointments:No future appointments. Follow up Visit:      09/30/2020 Allena Katz, MD

## 2020-09-30 NOTE — Lactation Note (Signed)
This note was copied from a baby's chart. Lactation Consultation Note  Patient Name: Holly Bryan SAYTK'Z Date: 09/30/2020 Reason for consult: Follow-up assessment;Primapara;1st time breastfeeding;Term;Infant weight loss;Other (Comment) (7 % weight loss) Age:31 hours Per mom the baby cluster feed last night and more swallows.  Baby awake and hungry.  LC reviewed latching for the football position and worked on depth/ positioning.  Baby fed for 20 mins. Latch of 9  Per mom was given comfort gels for soreness. Mom requested LC check nipples / both clear no breakdown/ LC reviewed hand expressing and had mom repeat demo.  Mom was able to express well.  LC reassured mom it gets easier.  LC reviewed D/C teaching for breast feeding.  Mom has the [facility] brochure with resources.   Maternal Data Has patient been taught Hand Expression?: Yes  Feeding Mother's Current Feeding Choice: Breast Milk  LATCH Score Latch: Grasps breast easily, tongue down, lips flanged, rhythmical sucking.  Audible Swallowing: Spontaneous and intermittent  Type of Nipple: Everted at rest and after stimulation  Comfort (Breast/Nipple): Soft / non-tender  Hold (Positioning): Assistance needed to correctly position infant at breast and maintain latch.  LATCH Score: 9   Lactation Tools Discussed/Used Tools: Shells  Interventions Interventions: Breast feeding basics reviewed;Assisted with latch;Skin to skin;Breast massage;Hand express;Breast compression;Support pillows;Position options;Shells;Education  Discharge Discharge Education: Engorgement and breast care Pump: Personal;Manual;DEBP  Consult Status Consult Status: Complete Date: 09/30/20    Kathrin Greathouse 09/30/2020, 10:21 AM

## 2020-09-30 NOTE — Anesthesia Postprocedure Evaluation (Signed)
Anesthesia Post Note  Patient: Holly Bryan  Procedure(s) Performed: AN AD HOC LABOR EPIDURAL     Patient location during evaluation: Mother Baby Anesthesia Type: Epidural Level of consciousness: awake and alert Pain management: pain level controlled Vital Signs Assessment: post-procedure vital signs reviewed and stable Respiratory status: spontaneous breathing, nonlabored ventilation and respiratory function stable Cardiovascular status: stable Postop Assessment: no headache, no backache and epidural receding Anesthetic complications: no   No notable events documented.  Last Vitals:  Vitals:   09/29/20 1331 09/29/20 2200  BP: 107/67 103/63  Pulse: (!) 59 (!) 52  Resp: 18 18  Temp: 36.6 C 36.8 C  SpO2: 99% 98%    Last Pain:  Vitals:   09/30/20 0234  TempSrc:   PainSc: 0-No pain   Pain Goal:                   Trevor Iha

## 2020-09-30 NOTE — Lactation Note (Signed)
This note was copied from a baby's chart. Lactation Consultation Note  Patient Name: Holly Bryan [id number]'U Date: 09/30/2020 Reason for consult: Follow-up assessment;Primapara;1st time breastfeeding;Term;Infant weight loss;Other (Comment) (7 % weight loss/ F/U to the 1st latch when baby was still feeding. per mom the baby latched again. LC updated the doc flow sheets.) Age:30 hours  Maternal Data Has patient been taught Hand Expression?: Yes  Feeding Mother's Current Feeding Choice: Breast Milk  LATCH Score - Earlier  Latch: Grasps breast easily, tongue down, lips flanged, rhythmical sucking.  Audible Swallowing: Spontaneous and intermittent  Type of Nipple: Everted at rest and after stimulation  Comfort (Breast/Nipple): Soft / non-tender  Hold (Positioning): Assistance needed to correctly position infant at breast and maintain latch.  LATCH Score: 9   Lactation Tools Discussed/Used Tools: Shells  Interventions Interventions: Breast feeding basics reviewed;Education  Discharge Discharge Education: Engorgement and breast care Pump: Personal;Manual;DEBP  Consult Status Consult Status: Complete Date: 09/30/20    [name] 09/30/2020, 12:02 PM

## 2020-10-01 LAB — SURGICAL PATHOLOGY

## 2020-10-11 ENCOUNTER — Telehealth (HOSPITAL_COMMUNITY): Payer: Self-pay | Admitting: *Deleted

## 2020-10-11 NOTE — Telephone Encounter (Signed)
Hospital discharge follow-up call attempted. Left message for patient to return RN call. Deforest Hoyles, RN, 10/11/20. [phone]

## 2021-05-29 ENCOUNTER — Emergency Department (HOSPITAL_BASED_OUTPATIENT_CLINIC_OR_DEPARTMENT_OTHER): Payer: 59 | Admitting: Radiology

## 2021-05-29 ENCOUNTER — Emergency Department (HOSPITAL_BASED_OUTPATIENT_CLINIC_OR_DEPARTMENT_OTHER)
Admission: EM | Admit: 2021-05-29 | Discharge: 2021-05-29 | Disposition: A | Discharge status: Home / Self Care | Payer: 59 | Attending: Emergency Medicine | Admitting: Emergency Medicine

## 2021-05-29 ENCOUNTER — Encounter (HOSPITAL_BASED_OUTPATIENT_CLINIC_OR_DEPARTMENT_OTHER): Payer: Self-pay

## 2021-05-29 ENCOUNTER — Other Ambulatory Visit: Payer: Self-pay

## 2021-05-29 DIAGNOSIS — M25512 Pain in left shoulder: Secondary | ICD-10-CM | POA: Diagnosis not present

## 2021-05-29 DIAGNOSIS — M549 Dorsalgia, unspecified: Secondary | ICD-10-CM

## 2021-05-29 LAB — PREGNANCY, URINE: Preg Test, Ur: NEGATIVE

## 2021-05-29 NOTE — ED Triage Notes (Signed)
Pt to er, pt states that she was working at her desk and had a sudden onset of L shoulder pain, states that it comes and goes, states that nothing specifically makes the pain come of go.  States that she has had no similar episodes in the past.  ?

## 2021-05-29 NOTE — Discharge Instructions (Addendum)
You have been seen and discharged from the emergency department.  The EKG of your heart was normal, the chest x-ray was normal.  Treat your symptoms symptomatically.  Follow-up with your primary provider for further evaluation and further care. Take home medications as prescribed. If you have any worsening symptoms, severe chest pain, difficulty breathing or further concerns for your health please return to an emergency department for further evaluation. ?

## 2021-05-29 NOTE — ED Provider Notes (Signed)
?[facility] EMER[facility]ENC[facility] DEPT ?Provider Note ? ? ?CSN: [id number] ?Arrival date & time: 05/29/21  1459 ? ?  ? ?History ? ?Chief Complaint  ?Patient presents with  ? Shoulder Pain  ? ? ?Takeela Peil is a 31 y.o. female. ? ?HPI ? ?31 year old female presents emergency department with sudden onset left upper trapezius/shoulder pain.  Patient states she was sitting at her desk working when it suddenly came on.  She described it as a warm and sharp feeling.  States that it is intermittent, comes and goes, self resolves.  Not specifically worse with deep breaths, coughing or specific movements.  She has had similar pain in the past but nothing this persistent or severe.  No recent illness.  No neck pain, head pain, arm pain, arm discoloration, chest/back pain. ? ?Home Medications ?Prior to Admission medications   ?Medication Sig Start Date End Date Taking? Authorizing Provider  ?acetaminophen (TYLENOL) 325 MG tablet Take 2 tablets (650 mg total) by mouth every 6 (six) hours as needed (for pain scale < 4). 09/30/20   Harold Hedge, MD  ?ibuprofen (ADVIL) 600 MG tablet Take 1 tablet (600 mg total) by mouth every 6 (six) hours as needed. 09/30/20   Harold Hedge, MD  ?Prenatal Vit-DSS-Fe Cbn-FA (PRENATAL AD PO) Take by mouth.    [provider]  ?norethindrone-ethinyl estradiol (JUNEL FE,GILDESS FE,LOESTRIN FE) 1-20 MG-MCG tablet Take 1 tablet by mouth daily.  08/09/20  [provider]  ?   ? ?Allergies    ?Sulfa antibiotics   ? ?Review of Systems   ?Review of Systems  ?Constitutional:  Negative for fever.  ?Respiratory:  Negative for shortness of breath.   ?Cardiovascular:  Negative for chest pain.  ?Gastrointestinal:  Negative for abdominal pain, diarrhea and vomiting.  ?Musculoskeletal:  Negative for back pain.  ?     +left shoulder/neck pain  ?Skin:  Negative for rash.  ?Neurological:  Negative for headaches.  ? ?Physical Exam ?Updated Vital Signs ?BP 117/71 (BP Location: Right Arm)   Pulse  (!) 58   Temp 98.1 ?F (36.7 ?C) (Oral)   Resp 14   Ht 5' 7.5" (1.715 m)   Wt 78 kg   SpO2 100%   BMI 26.54 kg/m?  ?Physical Exam ?Vitals and nursing note reviewed.  ?Constitutional:   ?   General: She is not in acute distress. ?   Appearance: Normal appearance. She is not diaphoretic.  ?HENT:  ?   Head: Normocephalic.  ?   Mouth/Throat:  ?   Mouth: Mucous membranes are moist.  ?Cardiovascular:  ?   Rate and Rhythm: Normal rate.  ?Pulmonary:  ?   Effort: Pulmonary effort is normal. No respiratory distress.  ?   Breath sounds: Normal breath sounds. No wheezing.  ?Abdominal:  ?   Palpations: Abdomen is soft.  ?   Tenderness: There is no abdominal tenderness.  ?Musculoskeletal:  ?   Cervical back: No rigidity or tenderness.  ?   Comments: Radial pulses equal  ?Skin: ?   General: Skin is warm.  ?   Findings: No rash.  ?Neurological:  ?   Mental Status: She is alert and oriented to person, place, and time. Mental status is at baseline.  ?Psychiatric:     ?   Mood and Affect: Mood normal.  ? ? ?ED Results / Procedures / Treatments   ?Labs ?(all labs ordered are listed, but only abnormal results are displayed) ?Labs Reviewed  ?PREGNANCY, URINE  ? ? ?EKG ?EKG Interpretation ? ?  Date/Time:  Wednesday May 29 2021 15:28:05 EST ?Ventricular Rate:  57 ?PR Interval:  164 ?QRS Duration: 109 ?QT Interval:  456 ?QTC Calculation: 444 ?R Axis:   51 ?Text Interpretation: Sinus arrhythmia Low voltage, precordial leads Confirmed by Coralee Pesa [phone]) on 05/29/2021 3:32:38 PM ? ?Radiology ?No results found. ? ?Procedures ?Procedures  ? ? ?Medications Ordered in ED ?Medications - No data to display ? ?ED Course/ Medical Decision Making/ A&P ?  ?                        ?Medical Decision Making ?Amount and/or Complexity of Data Reviewed ?Labs: ordered. ?Radiology: ordered. ? ? ?31 year old female presents emergency department with concern for left upper back/neck and shoulder pain.  Patient states that this started suddenly while she  was sitting at her desk.  The pain does not radiate to her chest or back, no associated shortness of breath or cough.  No swelling of her lower extremities.  Patient is currently breast-feeding, does not believe that she is pregnant.  She has had discomfort similar like this in the past that she is attributed to GERD but is never been this persistent or severe. ? ?Patient's pain appears to be along the left upper trapezius muscle.  She has no chest pain or shortness of breath, no pleuritic or positional pain.  Lung sounds are normal.  EKG concerning findings.  Chest x-ray shows no pneumothorax or bony abnormality.  Low suspicion for ACS.  No tachycardia or hypoxia, she is PERC negative, low suspicion for PE.  The left upper extremities otherwise neurovascularly intact, no associated neck pain or neuro symptoms.  Plan to treat symptomatically. ? ?Patient at this time appears safe and stable for discharge and close outpatient follow up. Discharge plan and strict return to ED precautions discussed, patient verbalizes understanding and agreement. ? ? ? ? ? ? ? ?Final Clinical Impression(s) / ED Diagnoses ?Final diagnoses:  ?None  ? ? ?Rx / DC Orders ?ED Discharge Orders   ? ? None  ? ?  ? ? ?  ?Rozelle Logan, DO ?05/29/21 1835 ? ?

## 2021-05-29 NOTE — ED Notes (Signed)
Patient transported to X-ray 

## 2021-10-29 ENCOUNTER — Other Ambulatory Visit (HOSPITAL_COMMUNITY): Payer: Self-pay | Admitting: Physician Assistant

## 2021-10-29 ENCOUNTER — Other Ambulatory Visit: Payer: Self-pay | Admitting: Physician Assistant

## 2021-10-29 DIAGNOSIS — R109 Unspecified abdominal pain: Secondary | ICD-10-CM

## 2021-10-29 DIAGNOSIS — K760 Fatty (change of) liver, not elsewhere classified: Secondary | ICD-10-CM

## 2021-11-06 ENCOUNTER — Ambulatory Visit (HOSPITAL_COMMUNITY)
Admission: RE | Admit: 2021-11-06 | Discharge: 2021-11-06 | Disposition: A | Discharge status: Home / Self Care | Payer: 59 | Source: Ambulatory Visit | Attending: Physician Assistant | Admitting: Physician Assistant

## 2021-11-06 DIAGNOSIS — R109 Unspecified abdominal pain: Secondary | ICD-10-CM | POA: Diagnosis present

## 2021-11-06 DIAGNOSIS — K760 Fatty (change of) liver, not elsewhere classified: Secondary | ICD-10-CM | POA: Insufficient documentation

## 2021-11-06 MED ORDER — TECHNETIUM TC 99M MEBROFENIN IV KIT
5.0000 | PACK | Freq: Once | INTRAVENOUS | Status: AC | PRN
  Administered 2021-11-06: 5 via INTRAVENOUS

## 2022-02-03 LAB — OB RESULTS CONSOLE HEPATITIS B SURFACE ANTIGEN: Hepatitis B Surface Ag: NEGATIVE

## 2022-02-03 LAB — OB RESULTS CONSOLE HIV ANTIBODY (ROUTINE TESTING): HIV: NONREACTIVE

## 2022-03-24 NOTE — L&D Delivery Note (Signed)
Delivery Note At 8:50 AM a viable female was delivered via Vaginal, Spontaneous (Presentation: Face; mentum anterior).  APGAR: 8, 9; weight pending.   Placenta status: Spontaneous, Intact.  Cord: 3 vessels with the following complications: none.  Cord pH: n/a  Anesthesia: Local Episiotomy: Median Lacerations:  2nd degree Suture Repair: 3.0 vicryl rapide Est. Blood Loss (mL): 253  Mom to postpartum.  Baby to Couplet care / Skin to Skin.  Dr. Eric Form was asked to evaluate newborn for airway concerns given labial edema from face presentation.  Dr. Eric Form cleared newborn but expressed concern for feeding.    Mitchel Honour 09/02/2022, 9:26 AM

## 2022-09-02 ENCOUNTER — Encounter (HOSPITAL_COMMUNITY): Payer: Self-pay

## 2022-09-02 ENCOUNTER — Inpatient Hospital Stay (HOSPITAL_COMMUNITY)
Admission: AD | Admit: 2022-09-02 | Discharge: 2022-09-04 | DRG: 807 | Disposition: A | Discharge status: Home / Self Care | Payer: 59 | Attending: Obstetrics & Gynecology | Admitting: Obstetrics & Gynecology

## 2022-09-02 DIAGNOSIS — Z3A39 39 weeks gestation of pregnancy: Secondary | ICD-10-CM | POA: Diagnosis not present

## 2022-09-02 DIAGNOSIS — O323XX0 Maternal care for face, brow and chin presentation, not applicable or unspecified: Principal | ICD-10-CM | POA: Diagnosis present

## 2022-09-02 DIAGNOSIS — O26893 Other specified pregnancy related conditions, third trimester: Secondary | ICD-10-CM | POA: Diagnosis present

## 2022-09-02 DIAGNOSIS — F419 Anxiety disorder, unspecified: Secondary | ICD-10-CM | POA: Diagnosis present

## 2022-09-02 DIAGNOSIS — Z3490 Encounter for supervision of normal pregnancy, unspecified, unspecified trimester: Secondary | ICD-10-CM

## 2022-09-02 DIAGNOSIS — O99344 Other mental disorders complicating childbirth: Secondary | ICD-10-CM | POA: Diagnosis present

## 2022-09-02 DIAGNOSIS — O99824 Streptococcus B carrier state complicating childbirth: Secondary | ICD-10-CM | POA: Diagnosis present

## 2022-09-02 LAB — CBC
HCT: 38.5 % (ref 36.0–46.0)
Hemoglobin: 13.3 g/dL (ref 12.0–15.0)
MCH: 30.8 pg (ref 26.0–34.0)
MCHC: 34.5 g/dL (ref 30.0–36.0)
MCV: 89.1 fL (ref 80.0–100.0)
Platelets: 231 10*3/uL (ref 150–400)
RBC: 4.32 MIL/uL (ref 3.87–5.11)
RDW: 13.4 % (ref 11.5–15.5)
WBC: 10.3 10*3/uL (ref 4.0–10.5)
nRBC: 0 % (ref 0.0–0.2)

## 2022-09-02 LAB — RPR: RPR Ser Ql: NONREACTIVE

## 2022-09-02 LAB — TYPE AND SCREEN
ABO/RH(D): A POS
Antibody Screen: NEGATIVE

## 2022-09-02 MED ORDER — IBUPROFEN 600 MG PO TABS
600.0000 mg | ORAL_TABLET | Freq: Four times a day (QID) | ORAL | Status: DC
  Administered 2022-09-02 – 2022-09-04 (×8): 600 mg via ORAL
  Filled 2022-09-02 (×8): qty 1

## 2022-09-02 MED ORDER — SODIUM CHLORIDE 0.9 % IV SOLN
1.0000 g | INTRAVENOUS | Status: DC
  Filled 2022-09-02: qty 1000

## 2022-09-02 MED ORDER — ONDANSETRON HCL 4 MG PO TABS: 4.0000 mg | ORAL_TABLET | ORAL | Status: DC | PRN

## 2022-09-02 MED ORDER — LACTATED RINGERS IV SOLN: 500.0000 mL | INTRAVENOUS | Status: DC | PRN

## 2022-09-02 MED ORDER — DIPHENHYDRAMINE HCL 25 MG PO CAPS: 25.0000 mg | ORAL_CAPSULE | Freq: Four times a day (QID) | ORAL | Status: DC | PRN

## 2022-09-02 MED ORDER — SENNOSIDES-DOCUSATE SODIUM 8.6-50 MG PO TABS
2.0000 | ORAL_TABLET | Freq: Every day | ORAL | Status: DC
  Administered 2022-09-03: 2 via ORAL
  Filled 2022-09-02: qty 2

## 2022-09-02 MED ORDER — ACETAMINOPHEN 325 MG PO TABS: 650.0000 mg | ORAL_TABLET | ORAL | Status: DC | PRN

## 2022-09-02 MED ORDER — WITCH HAZEL-GLYCERIN EX PADS
1.0000 | MEDICATED_PAD | CUTANEOUS | Status: DC | PRN
  Administered 2022-09-02: 1 via TOPICAL

## 2022-09-02 MED ORDER — LACTATED RINGERS IV SOLN: INTRAVENOUS | Status: DC

## 2022-09-02 MED ORDER — OXYCODONE-ACETAMINOPHEN 5-325 MG PO TABS: 2.0000 | ORAL_TABLET | ORAL | Status: DC | PRN

## 2022-09-02 MED ORDER — OXYTOCIN-SODIUM CHLORIDE 30-0.9 UT/500ML-% IV SOLN
2.5000 [IU]/h | INTRAVENOUS | Status: DC
  Administered 2022-09-02: 2.5 [IU]/h via INTRAVENOUS
  Filled 2022-09-02: qty 500

## 2022-09-02 MED ORDER — SOD CITRATE-CITRIC ACID 500-334 MG/5ML PO SOLN: 30.0000 mL | ORAL | Status: DC | PRN

## 2022-09-02 MED ORDER — LIDOCAINE HCL (PF) 1 % IJ SOLN
30.0000 mL | INTRAMUSCULAR | Status: DC | PRN
  Filled 2022-09-02: qty 30

## 2022-09-02 MED ORDER — FLEET ENEMA 7-19 GM/118ML RE ENEM: 1.0000 | ENEMA | Freq: Every day | RECTAL | Status: DC | PRN

## 2022-09-02 MED ORDER — ONDANSETRON HCL 4 MG/2ML IJ SOLN: 4.0000 mg | INTRAMUSCULAR | Status: DC | PRN

## 2022-09-02 MED ORDER — SODIUM CHLORIDE 0.9 % IV SOLN: 2.0000 g | Freq: Once | INTRAVENOUS | Status: DC

## 2022-09-02 MED ORDER — PRENATAL MULTIVITAMIN CH
1.0000 | ORAL_TABLET | Freq: Every day | ORAL | Status: DC
  Administered 2022-09-02 – 2022-09-03 (×2): 1 via ORAL
  Filled 2022-09-02 (×2): qty 1

## 2022-09-02 MED ORDER — TETANUS-DIPHTH-ACELL PERTUSSIS 5-2.5-18.5 LF-MCG/0.5 IM SUSY: 0.5000 mL | PREFILLED_SYRINGE | Freq: Once | INTRAMUSCULAR | Status: DC

## 2022-09-02 MED ORDER — OXYCODONE-ACETAMINOPHEN 5-325 MG PO TABS: 1.0000 | ORAL_TABLET | ORAL | Status: DC | PRN

## 2022-09-02 MED ORDER — ACETAMINOPHEN 325 MG PO TABS
650.0000 mg | ORAL_TABLET | ORAL | Status: DC | PRN
  Administered 2022-09-04: 650 mg via ORAL
  Filled 2022-09-02: qty 2

## 2022-09-02 MED ORDER — COCONUT OIL OIL
1.0000 | TOPICAL_OIL | Status: DC | PRN
  Administered 2022-09-03: 1 via TOPICAL

## 2022-09-02 MED ORDER — ZOLPIDEM TARTRATE 5 MG PO TABS: 5.0000 mg | ORAL_TABLET | Freq: Every evening | ORAL | Status: DC | PRN

## 2022-09-02 MED ORDER — ONDANSETRON HCL 4 MG/2ML IJ SOLN: 4.0000 mg | Freq: Four times a day (QID) | INTRAMUSCULAR | Status: DC | PRN

## 2022-09-02 MED ORDER — DIBUCAINE (PERIANAL) 1 % EX OINT: 1.0000 | TOPICAL_OINTMENT | CUTANEOUS | Status: DC | PRN

## 2022-09-02 MED ORDER — BENZOCAINE-MENTHOL 20-0.5 % EX AERO
1.0000 | INHALATION_SPRAY | CUTANEOUS | Status: DC | PRN
  Administered 2022-09-02: 1 via TOPICAL
  Filled 2022-09-02: qty 56

## 2022-09-02 MED ORDER — SIMETHICONE 80 MG PO CHEW: 80.0000 mg | CHEWABLE_TABLET | ORAL | Status: DC | PRN

## 2022-09-02 MED ORDER — OXYTOCIN BOLUS FROM INFUSION
333.0000 mL | Freq: Once | INTRAVENOUS | Status: AC
  Administered 2022-09-02: 333 mL via INTRAVENOUS

## 2022-09-02 NOTE — MAU Note (Signed)
...  Holly Bryan is a 32 y.o. at [redacted]w[redacted]d here in MAU reporting: CTX that began at 0630 this morning that are now "minutes apart." Denies LOF but reports bloody show. DFM since waking up.  GBS+  Onset of complaint: 0630 Pain score: 7/10 lower abdomen  FHT: 164 initial external Lab orders placed from triage: MAU Labor Eval

## 2022-09-02 NOTE — H&P (Signed)
Holly Bryan is a 32 y.o. female presenting for labor.  Patient reports CTX started at 0630 today.  She arrived to MAU and was 7 cm but SROM and complete.  She was brought immediately to L&D.  Antepartum course complicated by anxiety well controlled on sertraline.  GBS positive.  OB History     Gravida  2   Para  1   Term  1   Preterm      AB      Living  1      SAB      IAB      Ectopic      Multiple  0   Live Births  1          Past Medical History:  Diagnosis Date   Anxiety    Past Surgical History:  Procedure Laterality Date   NO PAST SURGERIES     Family History: family history includes Lymphoma in her father. Social History:  reports that she has never smoked. She has never used smokeless tobacco. She reports that she does not drink alcohol and does not use drugs.     Maternal Diabetes: No Genetic Screening: Normal Maternal Ultrasounds/Referrals: Normal Fetal Ultrasounds or other Referrals:  None Maternal Substance Abuse:  No Significant Maternal Medications:  Meds include: Zoloft Significant Maternal Lab Results:  Group B Strep positive Number of Prenatal Visits:greater than 3 verified prenatal visits Other Comments:  None  Review of Systems Maternal Medical History:  Reason for admission: Contractions.   Contractions: Onset was 1-2 hours ago.   Frequency: regular.   Perceived severity is strong.   Fetal activity: Perceived fetal activity is normal.   Last perceived fetal movement was within the past hour.   Prenatal complications: no prenatal complications Prenatal Complications - Diabetes: none.   Dilation: 10 Effacement (%): 100 Station: Plus 1 Exam by:: [employer], RN Height 5\' 8"  (1.727 m), weight 87.2 kg, unknown if currently breastfeeding. Maternal Exam:  Uterine Assessment: Contraction frequency is regular.  Abdomen: Patient reports no abdominal tenderness. Fundal height is c/w dates.   Fetal presentation:  vertex Introitus: Normal vulva. Pelvis: adequate for delivery.   Cervix: Cervix evaluated by digital exam.     Physical Exam Constitutional:      Appearance: Normal appearance.  HENT:     Head: Normocephalic and atraumatic.  Pulmonary:     Effort: Pulmonary effort is normal.  Abdominal:     Palpations: Abdomen is soft.  Genitourinary:    General: Normal vulva.  Musculoskeletal:        General: Normal range of motion.     Cervical back: Normal range of motion.  Skin:    General: Skin is warm and dry.  Neurological:     Mental Status: She is alert and oriented to person, place, and time.  Psychiatric:        Mood and Affect: Mood normal.        Behavior: Behavior normal.     Prenatal labs: ABO, Rh: A pos Antibody: Negative Rubella:  Immune RPR:   NR HBsAg: Negative (11/13 0000)  HIV: Non-reactive (11/13 0000)  GBS:   Positive  Assessment/Plan: 32yo G2P1001 at [redacted]w[redacted]d with labor -Patient delivered with face presentation shortly after arrival to L&D; please see delivery note for details.   -GBS positive but DID NOT receive antibiotics given rapid delivery   Mitchel Honour 09/02/2022, 9:18 AM

## 2022-09-03 LAB — CBC
HCT: 33.5 % — ABNORMAL LOW (ref 36.0–46.0)
Hemoglobin: 11.3 g/dL — ABNORMAL LOW (ref 12.0–15.0)
MCH: 30.1 pg (ref 26.0–34.0)
MCHC: 33.7 g/dL (ref 30.0–36.0)
MCV: 89.1 fL (ref 80.0–100.0)
Platelets: 184 10*3/uL (ref 150–400)
RBC: 3.76 MIL/uL — ABNORMAL LOW (ref 3.87–5.11)
RDW: 13.6 % (ref 11.5–15.5)
WBC: 9.5 10*3/uL (ref 4.0–10.5)
nRBC: 0 % (ref 0.0–0.2)

## 2022-09-03 NOTE — Progress Notes (Signed)
Holly Bryan was referred for history of anxiety.  * Referral screened out by Clinical Social Worker because none of the following criteria appear to apply:  ~ History of anxiety during this pregnancy, or of post-partum depression following prior delivery.  ~ Diagnosis of anxiety within last 3 years  OR  * Holly Bryan's symptoms currently being treated with medication and/or therapy.  Per chart review, OB has a active prescription for Zoloft 50mg.  Please contact the Clinical Social Worker if needs arise, by Holly Bryan request, or if Holly Bryan scores greater than 9/yes to question 10 on Edinburgh Postpartum Depression Screen.    Teodor Prater, LCSWA Clinical Social Worker [phone] 

## 2022-09-03 NOTE — Lactation Note (Signed)
This note was copied from a baby's chart. Lactation Consultation Note  Patient Name: [name] [id number] Date: 09/03/2022 Age:32 hours   LC attempted to visit with the birth parent but she was asleep. Lactation will follow-up soon.    Holly Bryan Holly Bryan 09/03/2022, 8:33 AM

## 2022-09-03 NOTE — Progress Notes (Signed)
PPD # 1  Doing well. Baby's facial swelling is improved. Some mild feeding difficulties.  BP 107/66 (BP Location: Right Arm)   Pulse 63   Temp 98 F (36.7 C) (Oral)   Resp 18   Ht 5\' 8"  (1.727 m)   Wt 87.2 kg   SpO2 100%   Breastfeeding Unknown   BMI 29.24 kg/m  Uterus is firm and non tender Lochia WNL Results for orders placed or performed during the hospital encounter of 09/02/22 (from the past 24 hour(s))  CBC     Status: None   Collection Time: 09/02/22  8:18 AM  Result Value Ref Range   WBC 10.3 4.0 - 10.5 K/uL   RBC 4.32 3.87 - 5.11 MIL/uL   Hemoglobin 13.3 12.0 - 15.0 g/dL   HCT 56.2 13.0 - 86.5 %   MCV 89.1 80.0 - 100.0 fL   MCH 30.8 26.0 - 34.0 pg   MCHC 34.5 30.0 - 36.0 g/dL   RDW 78.4 69.6 - 29.5 %   Platelets 231 150 - 400 K/uL   nRBC 0.0 0.0 - 0.2 %  Type and screen [facility]     Status: None   Collection Time: 09/02/22  8:18 AM  Result Value Ref Range   ABO/RH(D) A POS    Antibody Screen NEG    Sample Expiration      09/05/2022,2359 Performed at [facility], [address], [place], [place] [address]   RPR     Status: None   Collection Time: 09/02/22  8:18 AM  Result Value Ref Range   RPR Ser Ql NON REACTIVE NON REACTIVE  CBC     Status: Abnormal   Collection Time: 09/03/22  4:18 AM  Result Value Ref Range   WBC 9.5 4.0 - 10.5 K/uL   RBC 3.76 (L) 3.87 - 5.11 MIL/uL   Hemoglobin 11.3 (L) 12.0 - 15.0 g/dL   HCT 24.4 (L) 01.0 - 27.2 %   MCV 89.1 80.0 - 100.0 fL   MCH 30.1 26.0 - 34.0 pg   MCHC 33.7 30.0 - 36.0 g/dL   RDW 53.6 64.4 - 03.4 %   Platelets 184 150 - 400 K/uL   nRBC 0.0 0.0 - 0.2 %   PPD # 1  Doing well Routine care Discharge home tomorrow

## 2022-09-03 NOTE — Lactation Note (Signed)
This note was copied from a baby's chart. Lactation Consultation Note  Patient Name: Holly Bryan [id number] Date: 09/03/2022 Age:32 hours Reason for consult: Initial assessment;Term;Infant weight loss;Breastfeeding assistance (5.13% WL) The infant was at 55 hours old.  LC entered the room and the birth parent was feeding the infant on the right breast.  The birth parent stated that she did not notice as many breast changes with this pregnancy.  She said that she pumped yesterday and she was only pumping drops.  She was told by the night time RN that she should be pumping 20 mL on day 1.  LC spoke with the birth parent about milk production, supply and demand, and infant behavior.  LC reviewed pumping frequency, milk storage, outpatient services, supplementation guidelines, and how to tell if the infant is getting enough at the breast.  All questions were answered.   Infant Feeding Plan:  Breastfeed 8+ times in 24 hours according to feeding cues.  Put the infant to the breast prior to supplementing with donor breast milk.  Supplement according to supplementation guidelines.  Pump after every other feeding and feed the expressed milk to the infant.     Maternal Data Has patient been taught Hand Expression?: Yes Does the patient have breastfeeding experience prior to this delivery?: Yes How long did the patient breastfeed?: She breast fed and pumped for 3 months and then pumped exclusively for 9 months  Feeding Mother's Current Feeding Choice: Breast Milk and Donor Milk  LATCH Score Latch: Grasps breast easily, tongue down, lips flanged, rhythmical sucking.  Audible Swallowing: A few with stimulation  Type of Nipple: Everted at rest and after stimulation  Comfort (Breast/Nipple): Soft / non-tender  Hold (Positioning): Assistance needed to correctly position infant at breast and maintain latch.  LATCH Score: 8   Lactation Tools Discussed/Used     Interventions Interventions: Assisted with latch;Adjust position;Support pillows;Education;LC Services brochure  Discharge Pump: DEBP;Personal  Consult Status Consult Status: Follow-up Date: 09/04/22 Follow-up type: In-patient    [name] Nickalas Mccarrick 09/03/2022, 10:10 AM

## 2022-09-04 ENCOUNTER — Encounter (HOSPITAL_COMMUNITY): Payer: Self-pay | Admitting: Obstetrics & Gynecology

## 2022-09-04 ENCOUNTER — Other Ambulatory Visit: Payer: Self-pay

## 2022-09-04 MED ORDER — IBUPROFEN 600 MG PO TABS: 600.0000 mg | ORAL_TABLET | Freq: Four times a day (QID) | ORAL | 0 refills | Status: AC | PRN

## 2022-09-04 MED ORDER — ACETAMINOPHEN 325 MG PO TABS: 650.0000 mg | ORAL_TABLET | Freq: Four times a day (QID) | ORAL | 0 refills | Status: AC | PRN

## 2022-09-04 NOTE — Lactation Note (Signed)
This note was copied from a baby's chart. Lactation Consultation Note  Patient Name: Holly Bryan [id number] Date: 09/04/2022 Age:32 years Reason for consult: Follow-up assessment;Nipple pain/trauma;Term;Infant weight loss;Breastfeeding assistance (7.12% WL) The infant was at 32 years old.  The birth parent was feeding the infant on the right breast in a cross-cradle position.  The infant was latched with her lips flanged, sucking was rhythmic, and some swallows were noted. The birth parent stated that they gave the infant some donor breast milk last night.  LC spoke with the birth parent about feedings lasting longer than 45 min and encouraged the birth parent not to continue past 45.  LC reviewed engorgement, breast care, warning signs, mastitis, infant I/O, and outpatient services.  The parents had no questions or concerns.   Infant Feeding Plan:  Breastfeed 8+ times per day according to feeding cues.  Pump if needed and supplement the infant with the expressed milk.  Watch infant output and call the pediatrician with concerns.  Call the outpatient lactation consultant with questions about breastfeeding.  Prioritize maternal nutrition, hydration, and rest.  Use ice rather than heat for engorgement.    Feeding Mother's Current Feeding Choice: Breast Milk and Donor Milk  LATCH Score Latch: Grasps breast easily, tongue down, lips flanged, rhythmical sucking.  Audible Swallowing: Spontaneous and intermittent  Type of Nipple: Everted at rest and after stimulation  Comfort (Breast/Nipple): Filling, red/small blisters or bruises, mild/mod discomfort  Hold (Positioning): No assistance needed to correctly position infant at breast.  LATCH Score: 9  Interventions Interventions: Education  Discharge Discharge Education: Engorgement and breast care;Warning signs for feeding baby;Outpatient recommendation  Consult Status Consult Status: Complete Date: 09/04/22 Follow-up  type: Call as needed   Delene Loll 09/04/2022, 10:53 AM

## 2022-09-04 NOTE — Discharge Summary (Signed)
Postpartum Discharge Summary  Date of Service updated 09/04/22     Patient Name: Holly Bryan DOB: Mar 05, 1991 MRN: [id number]  Date of admission: 09/02/2022 Delivery date:09/02/2022  Delivering provider: Mitchel Honour  Date of discharge: 09/04/2022  Admitting diagnosis: Normal labor [O80, Z37.9] Pregnancy [Z34.90] Intrauterine pregnancy: [redacted]w[redacted]d     Secondary diagnosis:  Principal Problem:   Normal labor Active Problems:   Pregnancy  Additional problems:     Discharge diagnosis: Term Pregnancy Delivered                                              Post partum procedures: Augmentation:  Complications: None  Hospital course: Onset of Labor With Vaginal Delivery      32 y.o. yo [id number] at [redacted]w[redacted]d was admitted in Active Labor on 09/02/2022. Labor course was complicated by face presentation  Membrane Rupture Time/Date: 8:17 AM ,09/02/2022   Delivery Method:Vaginal, Spontaneous  Episiotomy: Median  Lacerations:    Patient had a postpartum course complicated by .  She is ambulating, tolerating a regular diet, passing flatus, and urinating well. Patient is discharged home in stable condition on 09/04/22.  Newborn Data: Birth date:09/02/2022  Birth time:8:50 AM  Gender:Female  Living status:Living  Apgars:8 ,9  Weight:3020 g   Magnesium Sulfate received: No BMZ received: No Rhophylac:No MMR:No T-DaP:Given prenatally Flu: No Transfusion:No  Physical exam  Vitals:   09/03/22 0420 09/03/22 1400 09/03/22 2205 09/04/22 0545  BP: 107/66 114/70 118/66 119/72  Pulse: 63 (!) 46 (!) 52 66  Resp: 18 18 16 18   Temp: 98 F (36.7 C) 97.7 F (36.5 C) 98 F (36.7 C) 98.2 F (36.8 C)  TempSrc: Oral Oral Oral Oral  SpO2: 100%   100%  Weight:      Height:       General: alert, cooperative, and no distress Lochia: appropriate Uterine Fundus: firm Incision: Healing well with no significant drainage DVT Evaluation: No evidence of DVT seen on physical exam. Labs: Lab Results   Component Value Date   WBC 9.5 09/03/2022   HGB 11.3 (L) 09/03/2022   HCT 33.5 (L) 09/03/2022   MCV 89.1 09/03/2022   PLT 184 09/03/2022       No data to display         Edinburgh Score:    09/03/2022   11:14 AM  Edinburgh Postnatal Depression Scale Screening Tool  I have been able to laugh and see the funny side of things. 0  I have looked forward with enjoyment to things. 0  I have blamed myself unnecessarily when things went wrong. 2  I have been anxious or worried for no good reason. 2  I have felt scared or panicky for no good reason. 1  Things have been getting on top of me. 1  I have been so unhappy that I have had difficulty sleeping. 1  I have felt sad or miserable. 1  I have been so unhappy that I have been crying. 0  The thought of harming myself has occurred to me. 0  Edinburgh Postnatal Depression Scale Total 8      After visit meds:  Allergies as of 09/04/2022       Reactions   Sulfa Antibiotics Other (See Comments)   Unknown childhood reaction        Medication List     STOP taking  these medications    TUMS PO       TAKE these medications    acetaminophen 325 MG tablet Commonly known as: Tylenol Take 2 tablets (650 mg total) by mouth every 6 (six) hours as needed (for pain scale < 4).   ibuprofen 600 MG tablet Commonly known as: ADVIL Take 1 tablet (600 mg total) by mouth every 6 (six) hours as needed.   PRENATAL AD PO Take 1 tablet by mouth daily.   sertraline 50 MG tablet Commonly known as: ZOLOFT Take 50 mg by mouth at bedtime.         Discharge home in stable condition Infant Feeding: Breast Infant Disposition:home with mother Discharge instruction: per After Visit Summary and Postpartum booklet. Activity: Advance as tolerated. Pelvic rest for 6 weeks.  Diet: routine diet Anticipated Birth Control: Unsure Postpartum Appointment:6 weeks Additional Postpartum F/U:  Future Appointments:No future appointments. Follow  up Visit:      09/04/2022 Leslie Andrea, MD

## 2022-09-22 ENCOUNTER — Telehealth (HOSPITAL_COMMUNITY): Payer: Self-pay | Admitting: *Deleted

## 2022-09-22 NOTE — Telephone Encounter (Signed)
09/22/2022  Name: Holly Bryan MRN: [id number] DOB: 01-Dec-1990  Reason for Call:  Transition of Care Hospital Discharge Call  Contact Status: Patient Contact Status: Message  Language assistant needed: Interpreter Mode: Interpreter Not Needed        Follow-Up Questions:    [name] Postnatal Depression Scale:  In the Past 7 Days:    PHQ2-9 Depression Scale:     Discharge Follow-up:    Post-discharge interventions: NA  [name], RN 09/22/22 13:55pm

## 2022-12-18 IMAGING — US US ABDOMEN COMPLETE
1 series · 13 of 25 positions shown · non-contrast
Comparison: None.

CLINICAL DATA: Elevated liver enzymes, 35 weeks pregnant

EXAM:
ABDOMEN ULTRASOUND COMPLETE

[Series 1: us abdomen complete · 0.28mm/px · 13 of 81 slices shown]
[im 1/81]
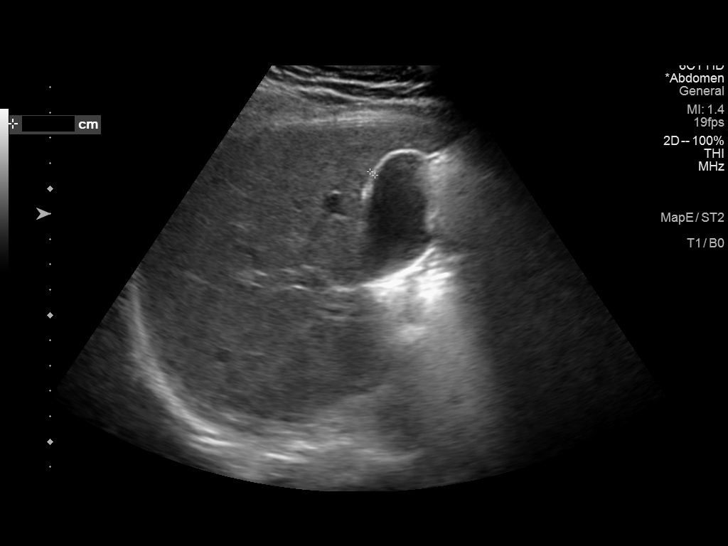
[im 7/81]
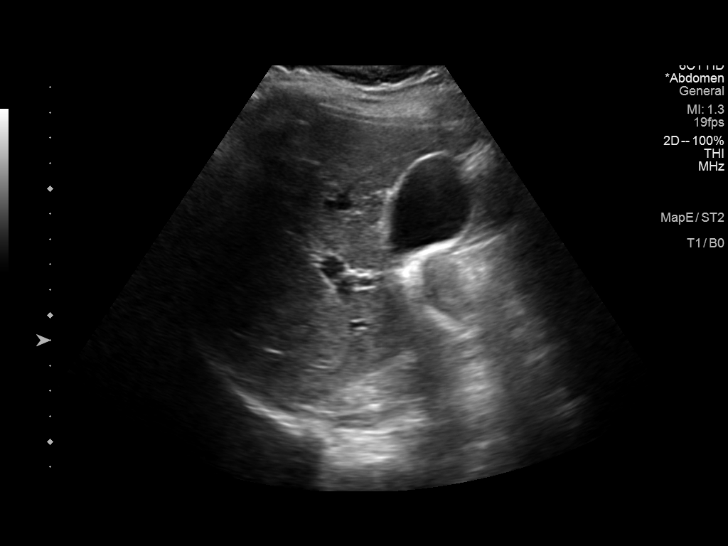
[im 14/81]
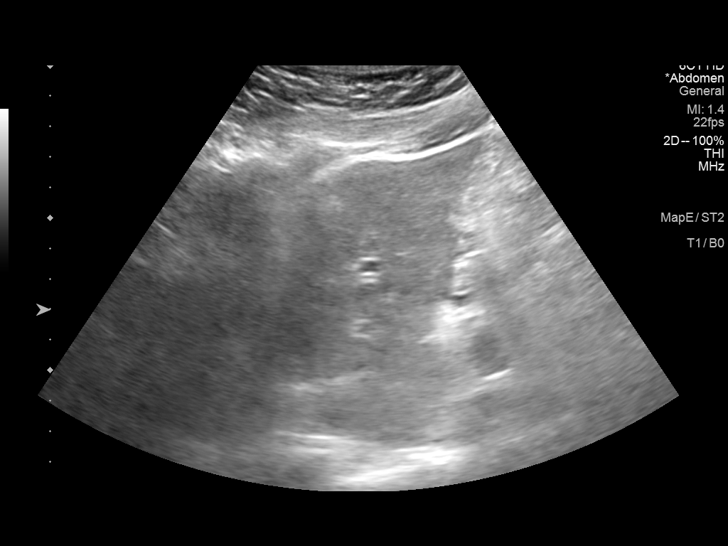
[im 21/81]
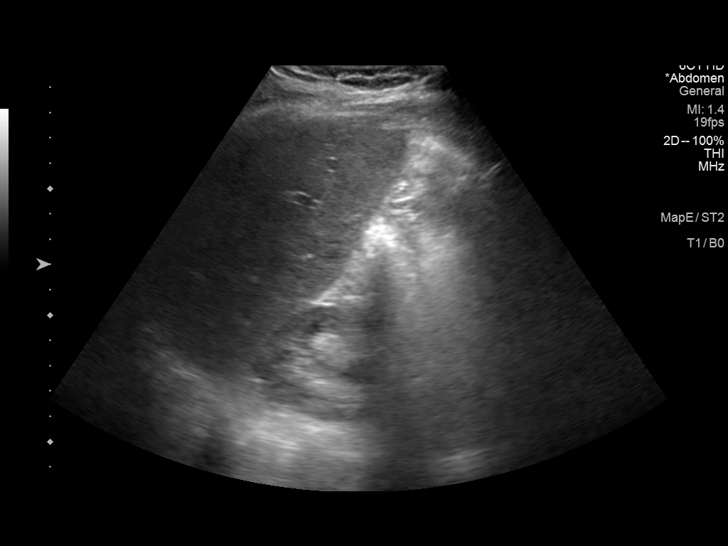
[im 27/81]
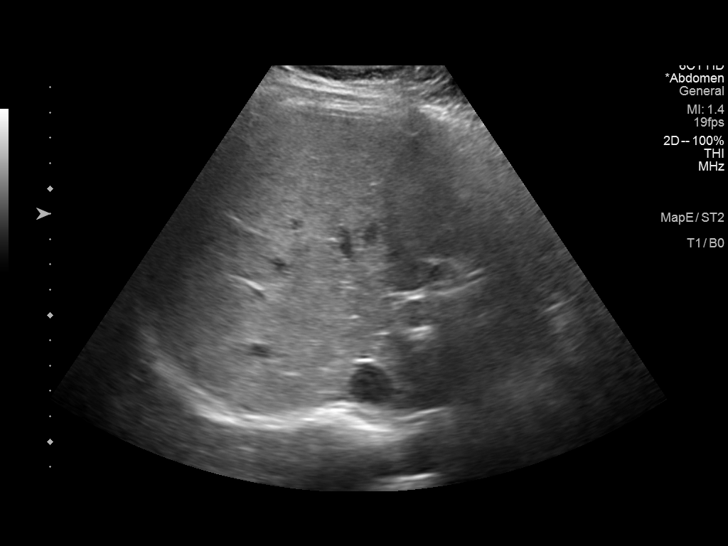
[im 34/81]
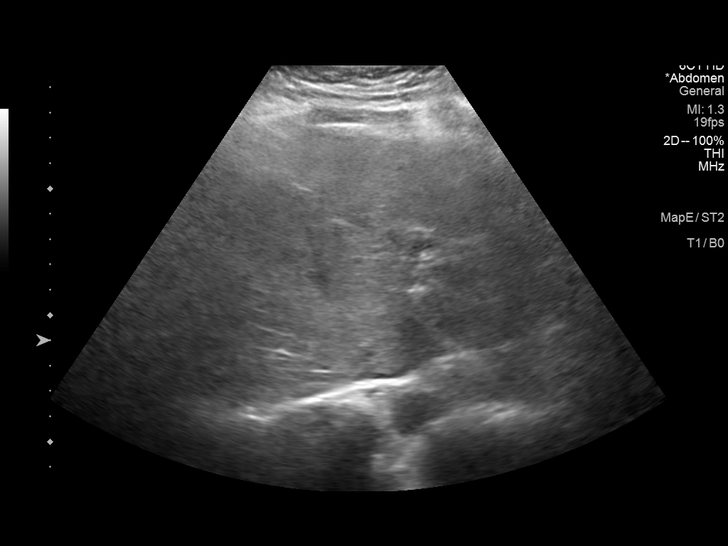
[im 41/81]
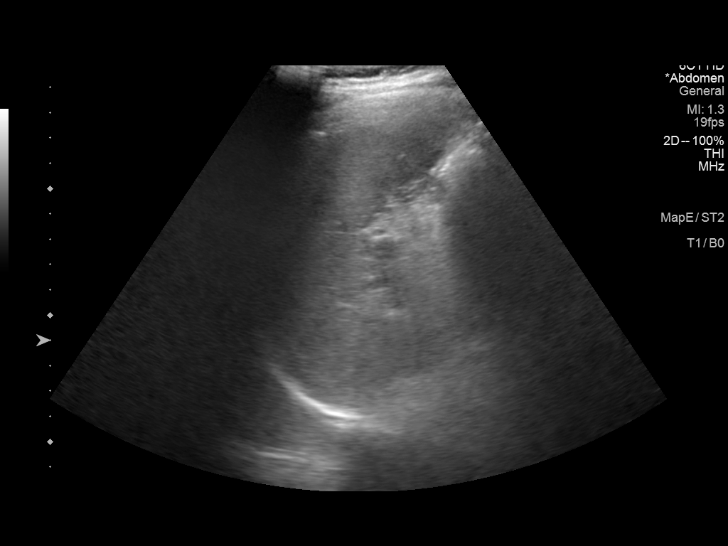
[im 47/81]
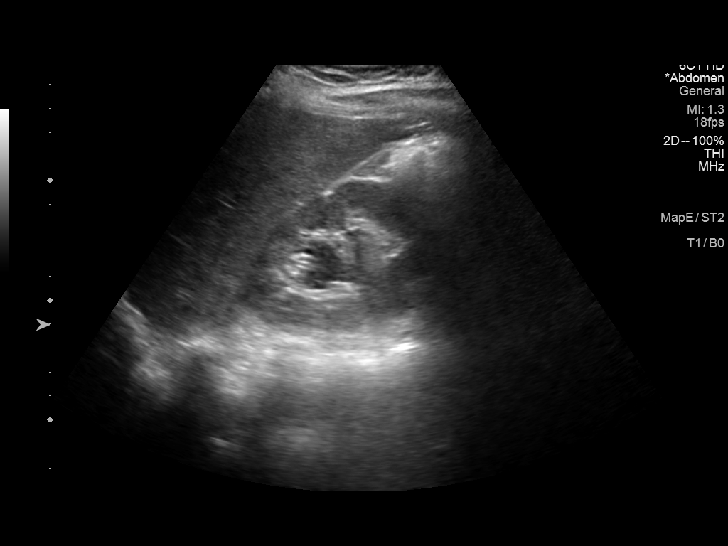
[im 54/81]
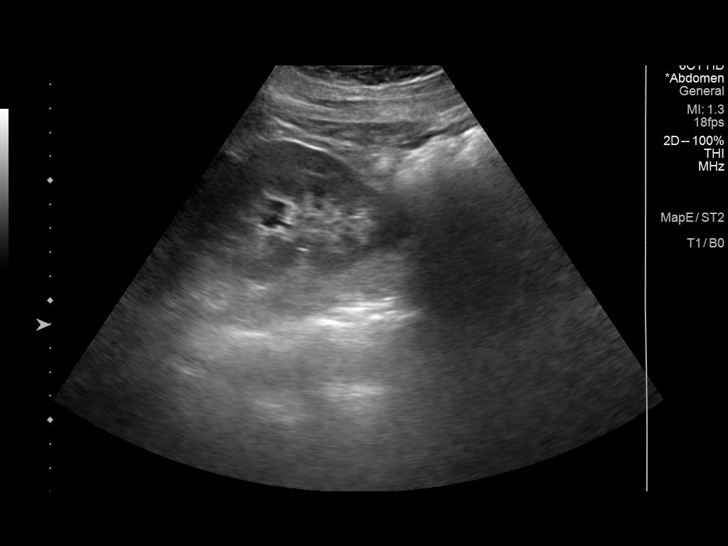
[im 61/81]
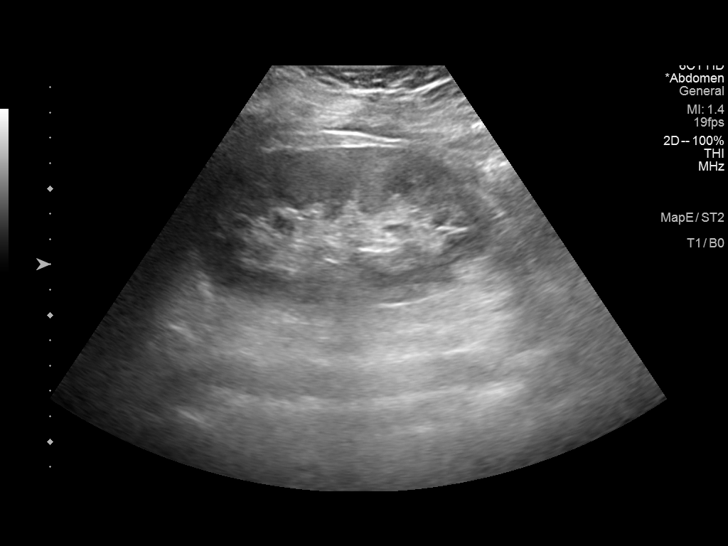
[im 67/81]
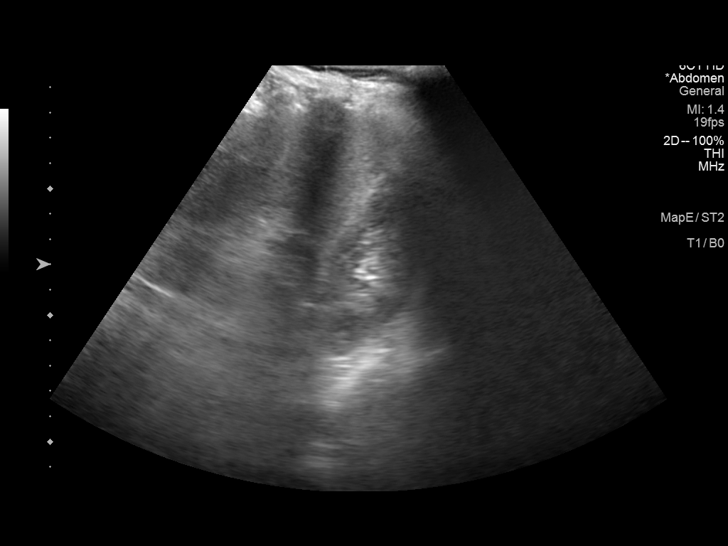
[im 74/81]
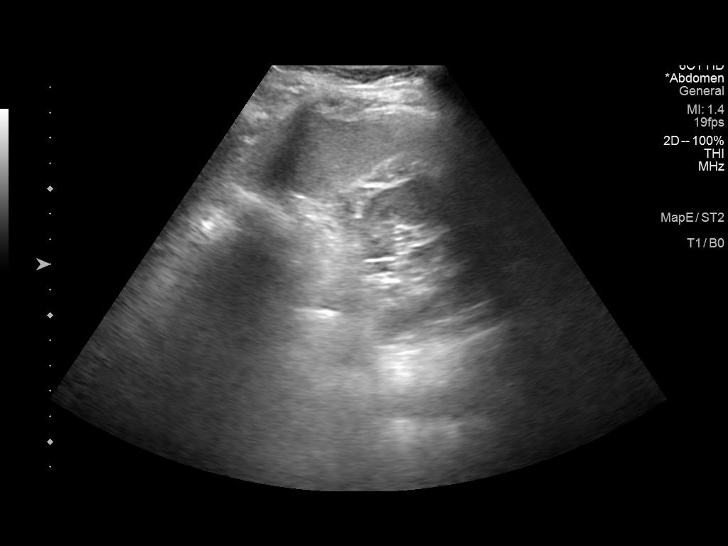
[im 81/81]
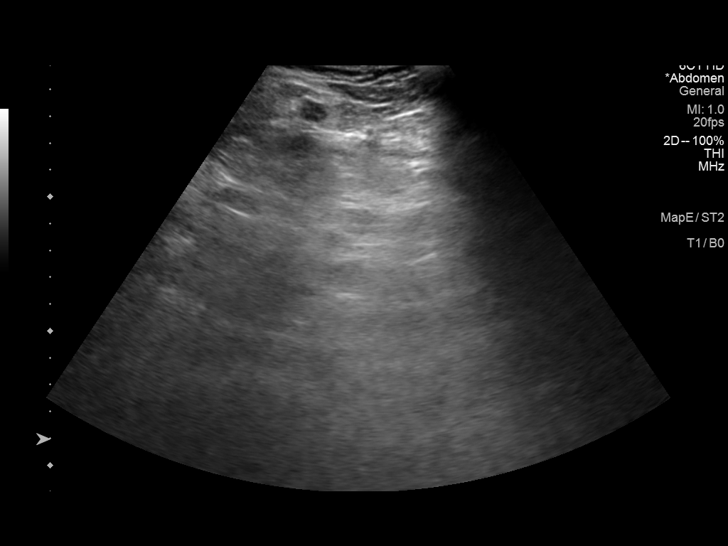

[13 of 25 positions shown; findings below may reference images not displayed]

FINDINGS: Gallbladder: No gallstones or wall thickening visualized. No
sonographic Murphy sign noted by sonographer.

Common bile duct: Diameter: 2.7 mm, non dilated

Liver: Mildly increased hepatic echogenicity with loss of definition
of the portal triads and diminished posterior through transmission
most often compatible with hepatic steatosis. Portal vein is patent
on color Doppler imaging with normal direction of blood flow towards
the liver.

IVC: Not well visualized.

Pancreas: Not well visualized.

Spleen: Length: 7 cm.  Size and appearance within normal limits.

Right Kidney: Length: 11 cm. Echogenicity within normal limits. No
mass. Mild-to-moderate hydronephrosis. No visible shadowing
calculus.

Left Kidney: Length: 11.9 cm. Echogenicity is within normal limits.
No concerning renal mass, shadowing calculus or hydronephrosis.

Abdominal aorta: Proximal aorta is unremarkable. Distal aorta
largely obscured.

Other findings: Technically challenging exam due to patient bowel
gas and limited sonographic windows in this third trimester patient.
IMPRESSION: 1. Technically challenging exam due to bowel gas and 35 week
gestation.
2. Mildly increased hepatic echogenicity, most often seen in the
setting of hepatic steatosis.
3. Mild to moderate right hydronephrosis. Suspect maternal
hydronephrosis of pregnancy though should correlate with urinalysis
and urinary symptoms.
